# Patient Record
Sex: Male | Born: 1990
Health system: Southern US, Community
[De-identification: ages and names within clinical notes are randomized; demographics above are authoritative.]

## PROBLEM LIST (undated history)

## (undated) DIAGNOSIS — G8929 Other chronic pain: Secondary | ICD-10-CM

## (undated) DIAGNOSIS — R740 Nonspecific elevation of levels of transaminase and lactic acid dehydrogenase [LDH]: Secondary | ICD-10-CM

## (undated) DIAGNOSIS — R7611 Nonspecific reaction to tuberculin skin test without active tuberculosis: Secondary | ICD-10-CM

## (undated) DIAGNOSIS — M549 Dorsalgia, unspecified: Secondary | ICD-10-CM

## (undated) DIAGNOSIS — M546 Pain in thoracic spine: Secondary | ICD-10-CM

## (undated) HISTORY — DX: Nonspecific reaction to tuberculin skin test without active tuberculosis: R76.11

## (undated) HISTORY — DX: Pain in thoracic spine: M54.6

## (undated) HISTORY — PX: TRIGGER POINT INJECTION: SHX2580

## (undated) HISTORY — DX: Dorsalgia, unspecified: M54.9

## (undated) HISTORY — DX: Nonspecific elevation of levels of transaminase and lactic acid dehydrogenase (ldh): R74.0

## (undated) HISTORY — DX: Other chronic pain: G89.29

---

## 2017-05-25 ENCOUNTER — Ambulatory Visit (INDEPENDENT_AMBULATORY_CARE_PROVIDER_SITE_OTHER): Payer: BLUE CROSS/BLUE SHIELD | Admitting: Physician Assistant

## 2017-05-25 ENCOUNTER — Encounter: Payer: Self-pay | Admitting: Physician Assistant

## 2017-05-25 VITALS — BP 128/87 | HR 73 | Ht 66.0 in | Wt 167.0 lb

## 2017-05-25 DIAGNOSIS — G2589 Other specified extrapyramidal and movement disorders: Secondary | ICD-10-CM | POA: Insufficient documentation

## 2017-05-25 DIAGNOSIS — Z7689 Persons encountering health services in other specified circumstances: Secondary | ICD-10-CM

## 2017-05-25 DIAGNOSIS — M546 Pain in thoracic spine: Secondary | ICD-10-CM

## 2017-05-25 DIAGNOSIS — Z111 Encounter for screening for respiratory tuberculosis: Secondary | ICD-10-CM

## 2017-05-25 DIAGNOSIS — M542 Cervicalgia: Secondary | ICD-10-CM | POA: Diagnosis not present

## 2017-05-25 DIAGNOSIS — G8929 Other chronic pain: Secondary | ICD-10-CM | POA: Diagnosis not present

## 2017-05-25 DIAGNOSIS — M549 Dorsalgia, unspecified: Secondary | ICD-10-CM | POA: Diagnosis not present

## 2017-05-25 HISTORY — DX: Other chronic pain: G89.29

## 2017-05-25 MED ORDER — BACLOFEN 10 MG PO TABS: 10.0000 mg | ORAL_TABLET | Freq: Three times a day (TID) | ORAL | 0 refills | Status: DC | PRN

## 2017-05-25 NOTE — Progress Notes (Addendum)
HPI:                                                                Isaac Myers is a 27 y.o. male who presents to [place]: [place] today to establish care  Patient has waived right to licensed interpreter and wife is acting as interpreter today.  Current concerns: chronic back pain  Patient reports persistent midline thoracic back pain between his shoulder blades x 2 years. He has seen multiple primary care doctors and an orthopedist for this problem. Pain is daily, intermittent, moderate in severity. Describes pain as achey and fatigued. Pain does not radiate. States pain is worse with sitting for a long time and with scapular retraction. Denies neck pain. Denies constitutional symptoms. Denies extremity weakness or paresthesias.  Reports nothing helps. 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 He denies any history of trauma or injury however.  Reports he has had normal x-rays of his spine at [facility]  Prior treatments: chiropractor, physical therapy x 3 months, trigger point injections x 2, NSAIDs (caused nausea)  Depression screen [facility] 2/9 05/25/2017  Decreased Interest 0  Down, Depressed, Hopeless 0  PHQ - 2 Score 0     Past Medical History:  Diagnosis Date  . Chronic back pain    Past Surgical History:  Procedure Laterality Date  . TRIGGER POINT INJECTION     back   Social History   Tobacco Use  . Smoking status: Never Smoker  . Smokeless tobacco: Never Used  Substance Use Topics  . Alcohol use: Yes   family history includes Diabetes in his mother.  ROS: Review of Systems  Constitutional: Positive for malaise/fatigue. Negative for chills, diaphoresis, fever and weight loss.  Respiratory: Positive for cough.   Musculoskeletal: Positive for back pain. Negative for joint pain and neck pain.  Neurological: Negative for sensory change and focal weakness.   Psychiatric/Behavioral: The patient has insomnia.   All other systems reviewed and are negative.    Medications: Current Outpatient Medications  Medication Sig Dispense Refill  . baclofen (LIORESAL) 10 MG tablet Take 1 tablet (10 mg total) by mouth 3 (three) times daily as needed for muscle spasms. 60 each 0   No current facility-administered medications for this visit.    No Known Allergies     Objective:  BP 128/87   Pulse 73   Ht _0  (1.676 m)   Wt 167 lb (75.8 kg)   SpO2 97%   BMI 26.95 kg/m  Gen:  alert, not ill-appearing, no distress, appropriate for age 45: head normocephalic without obvious abnormality, conjunctiva and cornea clear, trachea midline Pulm: Normal work of breathing, normal phonation Neuro: alert and oriented x 3, normal tone, no tremor MSK: back atraumatic, no deformity, full active ROM of spine, scapular retraction and external rotation reproduce pain, tender over the mid- thoracic spine at the level of the inferior angle of the scapula, full ROM of shoulders, strength 5/5 symmetric in bilateral upper extremities; normal gait and station Skin: intact, no rashes on exposed skin, no jaundice, no cyanosis Psych: well-groomed, cooperative, good eye contact, euthymic mood, affect mood-congruent, speech is articulate, and thought processes clear and goal-directed  No results found for this or any previous visit (from the past 72 hour(s)). No results found.  Thoracolumbar X-ray 10/24/2016    Assessment and Plan: 27 y.o. male with   1. Encounter to establish care - reviewed PMH, PSH, PFH, medications and allergies - reviewed health maintenance - due for influenza and Tdap - negative PHQ2  2. Chronic midline thoracic back pain - patient has had negative x-rays of the spine, failed conservative measures including PT, antiinflammatories, chiropractor, and trigger point injections. Proceeding with MR C-spine and Thoracic spine given pain is  periscapular - pain is axial, no radicular symptoms, no red flags. Will proceed with basic rheumatologic work-up, rule out TB/POTS with ppd and CXR - trial of Baclofen tid  - baclofen (LIORESAL) 10 MG tablet; Take 1 tablet (10 mg total) by mouth 3 (three) times daily as needed for muscle spasms.  Dispense: 60 each; Refill: 0 - MR Thoracic Spine Wo Contrast - MR Cervical Spine Wo Contrast; Future - DG Chest 2 View; Future - DG Thoracic Spine W/Swimmers - HLA-B27 antigen - CBC with Differential/Platelet - Comprehensive metabolic panel - Sedimentation rate   3. Encounter for PPD test - TB Skin Test, placed today - he will go to urgent care on Sunday for PPD read. Spoke to [facility] RN and confirmed  4. Cervicalgia - MR Cervical Spine Wo Contrast; Future  Patient education and anticipatory guidance given Patient agrees with treatment plan Follow-up in 2 weeks with Sports Medicine or sooner as needed if symptoms worsen or fail to improve  Darlyne Russian PA-C

## 2017-05-25 NOTE — Patient Instructions (Addendum)
-   Go to Urgent Care on Sunday to have PPD read  - Go to X-ray and Lab today  - Trial of Baclofen for pain

## 2017-05-27 ENCOUNTER — Emergency Department
Admission: EM | Admit: 2017-05-27 | Discharge: 2017-05-27 | Disposition: A | Discharge status: Home / Self Care | Payer: Self-pay | Source: Home / Self Care

## 2017-05-27 ENCOUNTER — Other Ambulatory Visit: Payer: Self-pay

## 2017-05-27 ENCOUNTER — Encounter: Payer: Self-pay | Admitting: *Deleted

## 2017-05-27 DIAGNOSIS — R7611 Nonspecific reaction to tuberculin skin test without active tuberculosis: Secondary | ICD-10-CM | POA: Diagnosis not present

## 2017-05-27 NOTE — ED Triage Notes (Signed)
Interpreter service used for patient interaction. ID [id number]  Patient is here for a PPD read [facility] [place] [facility] [place]. LFA is >7320mm induration & redness. Patient c/o dry cough x 3 months with body aches and HA. Denies travel out of the [place] in the past month. Per Waylan RocherErin Phelps, PA-C instruction, advised to call PCP office 1st thing tomorrow AM and and for an appointment for treatment, further testing and follow up. Encouraged patient to cover cough, do not share utensils and use good hand hygiene. Patient verbalized understanding and follow up instructions. Hand out of TB skin test/results given in Spanish.

## 2017-05-29 ENCOUNTER — Encounter: Payer: Self-pay | Admitting: Physician Assistant

## 2017-05-29 ENCOUNTER — Ambulatory Visit (INDEPENDENT_AMBULATORY_CARE_PROVIDER_SITE_OTHER): Payer: BLUE CROSS/BLUE SHIELD

## 2017-05-29 DIAGNOSIS — M549 Dorsalgia, unspecified: Secondary | ICD-10-CM | POA: Diagnosis not present

## 2017-05-29 DIAGNOSIS — J8410 Pulmonary fibrosis, unspecified: Secondary | ICD-10-CM

## 2017-05-29 DIAGNOSIS — Z227 Latent tuberculosis: Secondary | ICD-10-CM

## 2017-05-29 DIAGNOSIS — R7611 Nonspecific reaction to tuberculin skin test without active tuberculosis: Secondary | ICD-10-CM

## 2017-05-29 DIAGNOSIS — M5083 Other cervical disc disorders, cervicothoracic region: Secondary | ICD-10-CM | POA: Diagnosis not present

## 2017-05-29 DIAGNOSIS — M542 Cervicalgia: Secondary | ICD-10-CM | POA: Diagnosis not present

## 2017-05-29 DIAGNOSIS — M546 Pain in thoracic spine: Secondary | ICD-10-CM

## 2017-05-29 DIAGNOSIS — G8929 Other chronic pain: Secondary | ICD-10-CM

## 2017-05-29 DIAGNOSIS — R05 Cough: Secondary | ICD-10-CM | POA: Diagnosis not present

## 2017-05-29 HISTORY — DX: Nonspecific reaction to tuberculin skin test without active tuberculosis: R76.11

## 2017-05-29 LAB — SEDIMENTATION RATE: Sed Rate: 2 mm/h (ref 0–15)

## 2017-05-29 LAB — CBC WITH DIFFERENTIAL/PLATELET
BASOS ABS: 7 {cells}/uL (ref 0–200)
Basophils Relative: 0.1 %
EOS ABS: 158 {cells}/uL (ref 15–500)
Eosinophils Relative: 2.2 %
HEMATOCRIT: 46.4 % (ref 38.5–50.0)
Hemoglobin: 16.2 g/dL (ref 13.2–17.1)
Lymphs Abs: 2498 cells/uL (ref 850–3900)
MCH: 29.6 pg (ref 27.0–33.0)
MCHC: 34.9 g/dL (ref 32.0–36.0)
MCV: 84.8 fL (ref 80.0–100.0)
MPV: 11.1 fL (ref 7.5–12.5)
Monocytes Relative: 7.6 %
NEUTROS PCT: 55.4 %
Neutro Abs: 3989 cells/uL (ref 1500–7800)
PLATELETS: 239 10*3/uL (ref 140–400)
RBC: 5.47 10*6/uL (ref 4.20–5.80)
RDW: 12.4 % (ref 11.0–15.0)
Total Lymphocyte: 34.7 %
WBC: 7.2 10*3/uL (ref 3.8–10.8)
WBCMIX: 547 {cells}/uL (ref 200–950)

## 2017-05-29 LAB — COMPREHENSIVE METABOLIC PANEL
AG Ratio: 1.6 (calc) (ref 1.0–2.5)
ALBUMIN MSPROF: 4.9 g/dL (ref 3.6–5.1)
ALKALINE PHOSPHATASE (APISO): 87 U/L (ref 40–115)
ALT: 95 U/L — ABNORMAL HIGH (ref 9–46)
AST: 39 U/L (ref 10–40)
BILIRUBIN TOTAL: 0.6 mg/dL (ref 0.2–1.2)
BUN: 11 mg/dL (ref 7–25)
CALCIUM: 10 mg/dL (ref 8.6–10.3)
CO2: 30 mmol/L (ref 20–32)
CREATININE: 0.89 mg/dL (ref 0.60–1.35)
Chloride: 101 mmol/L (ref 98–110)
Globulin: 3 g/dL (calc) (ref 1.9–3.7)
Glucose, Bld: 91 mg/dL (ref 65–99)
POTASSIUM: 4.5 mmol/L (ref 3.5–5.3)
Sodium: 139 mmol/L (ref 135–146)
Total Protein: 7.9 g/dL (ref 6.1–8.1)

## 2017-05-29 LAB — HLA-B27 ANTIGEN: HLA-B27 ANTIGEN: NEGATIVE

## 2017-05-30 ENCOUNTER — Ambulatory Visit: Payer: BLUE CROSS/BLUE SHIELD | Admitting: Physician Assistant

## 2017-05-30 ENCOUNTER — Encounter: Payer: Self-pay | Admitting: Physician Assistant

## 2017-05-30 DIAGNOSIS — R740 Nonspecific elevation of levels of transaminase and lactic acid dehydrogenase [LDH]: Secondary | ICD-10-CM

## 2017-05-30 DIAGNOSIS — R7401 Elevation of levels of liver transaminase levels: Secondary | ICD-10-CM

## 2017-05-30 HISTORY — DX: Elevation of levels of liver transaminase levels: R74.01

## 2017-05-30 NOTE — Progress Notes (Unsigned)
Referral to ID placed for latent tuberculosis

## 2017-05-30 NOTE — Progress Notes (Signed)
Labs all look normal except for a mildly elevated liver enzyme. Plan to recheck this in 4 weeks. As you know, PPD test was positive. Chest x-ray did not show any active lung disease. It does show evidence of a prior TB infection. This is called latent TB. This is not contagious. Sometimes, this is treated with an oral medication to prevent active infection. I have consulted the infectious disease department and we may end up referring you to them. I will update you as soon as possible. Lastly, I do not yet have the results from the MRI. They should be in this afternoon. We will contact you asap.

## 2017-06-02 ENCOUNTER — Other Ambulatory Visit (HOSPITAL_BASED_OUTPATIENT_CLINIC_OR_DEPARTMENT_OTHER): Payer: BLUE CROSS/BLUE SHIELD

## 2017-06-04 ENCOUNTER — Encounter: Payer: Self-pay | Admitting: Family

## 2017-06-04 ENCOUNTER — Ambulatory Visit (INDEPENDENT_AMBULATORY_CARE_PROVIDER_SITE_OTHER): Payer: BLUE CROSS/BLUE SHIELD | Admitting: Family

## 2017-06-04 VITALS — BP 135/85 | HR 71 | Temp 97.5°F | Wt 169.0 lb

## 2017-06-04 DIAGNOSIS — R7611 Nonspecific reaction to tuberculin skin test without active tuberculosis: Secondary | ICD-10-CM | POA: Diagnosis not present

## 2017-06-04 NOTE — Assessment & Plan Note (Signed)
Isaac Myers appears to have latent TB with a positive PPD and negative chest x-ray. I do not think this is the cause of his back pain as previous imaging has been normal. He is at risk for converting to active TB. It is reasonable to obtain a CT scan to confirm no active disease prior to treatment for latent TB. We discussed the risks and benefits of the treatments and pending the CT scan results he wishes to continue with treatment through 4 months of rifampin. We will follow up in 1 month following the initiation of treatment.

## 2017-06-04 NOTE — Patient Instructions (Signed)
Nice to meet you!  We will get a CT scan of your chest to ensure no active disease prior to starting treatment.   If negative, we will send you in a medication call rifampin which you will take daily for 4 months for latent tuberculosis.    We will plan to follow up in a month following starting the medication.   Tuberculosis Tuberculosis La tuberculosis (TB) es una infeccin bacteriana que produce daos en los tejidos del cuerpo. Por lo general, afecta los pulmones, pero puede afectar otras partes del cuerpo. La TB tiene dos etapas:  TB activa. En esta etapa, la persona presenta los sntomas de TB y puede transmitir la enfermedad fcilmente (es contagiosa).  TB latente. En esta etapa, la persona no tiene ningn sntoma de TB y no contagia la enfermedad.  Es importante recibir tratamiento ms all del tipo de TB, porque la TB latente puede convertirse en TB [place]. Cules son las causas? La causa de la TB es una bacteria llamada Mycobacterium tuberculosis. Puede ocurrir una infeccin cuando Neomia Dear persona con TB activa tose o estornuda y Nature conservation officer la bacteria en el aire. Otra persona puede aspirar la bacteria, la que llega a los pulmones y causa una infeccin. Qu incrementa el riesgo? Entre los factores de riesgo se incluyen los siguientes:  VIH/sida.  Diabetes.  La edad. Los bebs y las personas de edad avanzada tienen ms probabilidades de contraer TB.  Consumir drogas ilegales.  El consumo de tabaco.  Vivir en o viajar a lugares en donde las tasas de TB son altas, por ejemplo: ? frica subsahariana. ? [place]. ? [place]. ? [place]. ? [place].  El hecho de vivir o de Printmaker en reas superpobladas o con mala ventilacin, lo que incluye: ? Establecimientos de atencin mdica. ? Establecimientos de atencin residencial. ? Campos de refugiados o refugios para personas sin hogar.  Cules son los signos o los sntomas?  Tos que dura tres semanas o ms.  Dolor en el pecho o  dolor al respirar o toser.  Prdida de peso sin causa aparente.  Fatiga y debilidad.  Grant Ruts.  Sudoracin.  Escalofros.  Prdida del apetito.  Tos con sangre o flema (esputo), o ambas cosas. Cmo se diagnostica? El diagnstico de TB puede incluir lo siguiente:  Un examen fsico y Neomia Dear historia clnica.  Una prueba cutnea.  Anlisis de [place].  Radiografas de trax.  Un estudio del esputo.  Anlisis de [place].  Cmo se trata? La TB se trata con antibiticos, que deben tomarse de 6a . Siga estas instrucciones en su casa:  Tome los antibiticos como se lo haya indicado el mdico. Termine los antibiticos aunque comience a sentirse mejor.  Concurra a todas las visitas de control como se lo haya indicado el mdico. Esto es importante.  Informe al mdico sobre todas las personas que viven con usted o con las que tiene contacto cercano. Su mdico o el Departamento de Salud puede hablar con estas personas para que se hagan estudios de TB.  Descanse todo lo que sea necesario.  No consuma ningn producto que contenga tabaco, lo que incluye cigarrillos, tabaco de Theatre manager o Administrator, Civil Service. Si necesita ayuda para dejar de fumar, consulte al American Express.  Evite el contacto cercano con los dems, especialmente con los bebs y las personas de edad avanzada, Teacher, adult education que el mdico le diga que ya no contagiar la TB.  Al toser o estornudar, cbrase la boca y la [place]. Deseche los pauelos descartables usados como se lo  haya indicado el mdico.  Lave sus manos frecuentemente con agua y [place].  No regrese al Aleen Campi ni al mbito de estudio hasta que el mdico lo autorice. Comunquese con un mdico si:  Aparecen nuevos sntomas.  Pierde el apetito.  Siente nuseas o vomita.  Orina de color amarillo oscuro.  La piel o la parte blanca de los ojos se torna de Educational psychologist.  Los sntomas persisten o empeoran.  Tiene fiebre. Solicite ayuda de inmediato  si:  Midwife.  Tose y escupe sangre.  Siente dificultad para respirar o Company secretary.  Siente dolor de [place] o rigidez en el cuello. Esta informacin no tiene Theme park manager el consejo del mdico. Asegrese de hacerle al mdico cualquier pregunta que tenga. Document Released: 02/08/2005 Document Revised: 07/26/2016 Document Reviewed: 08/06/2013 Elsevier Interactive Patient Education  2018 [employer].  Rifampin capsules Qu es este medicamento? La RIFAMPICINA es un antibitico. Se utiliza para tratar o prevenir ciertos tipos de infecciones bacterianas. Se utiliza para tratar o prevenir la tuberculosis (TB). No es efectivo para resfros, gripe u otras infecciones de origen viral. Este medicamento puede ser utilizado para otros usos; si tiene alguna pregunta consulte con su proveedor de atencin mdica o con su farmacutico. MARCAS COMUNES: Rifadin, Rimactane Qu le debo informar a mi profesional de la salud antes de tomar este medicamento? Necesita saber si usted presenta alguno de los [employer] o situaciones: -enfermedad heptica, incluyendo hepatitis -una reaccin alrgica o inusual a la rifampicina, la rifabutina, a otros medicamentos, alimentos, colorantes o conservantes -si est embarazada o buscando quedar embarazada -si est amamantando a un beb Cmo debo utilizar este medicamento? Tome este medicamento por va oral con un vaso de agua. Siga las instrucciones de la etiqueta del [place]. [employer] Communications medicamento con el estmago vaco, entre 1 hora antes o por lo menos 2 horas despus de las comidas. Tome sus dosis a intervalos regulares. No tome su medicamento con una frecuencia mayor a la indicada. Para asegurarse de que su terapia funcione de la mejor manera posible, asegrese de tomar cada dosis exactamente como le hayan indicado. No omita ninguna dosis o suspenda el uso de su medicamento aun si se siente mejor. Si omite dosis la TB puede  hacerse resistente a Industrial/product designer y [employer]. No deje de tomarlo excepto si as lo indica su mdico. Hable con su pediatra o su profesional de la salud para informarse acerca del uso de [place] medicamento en nios. Puede requerir atencin especial. Sobredosis: Pngase en contacto inmediatamente con un centro toxicolgico o una sala de urgencia si usted cree que haya tomado demasiado medicamento. ATENCIN: Reynolds American es solo para usted. No comparta este medicamento con nadie. Qu sucede si me olvido de una dosis? Si olvida una dosis, tmela lo antes posible. Si es casi la hora de la prxima dosis, tome slo esa dosis. No tome dosis adicionales o dobles. Qu puede interactuar con este medicamento? No tome esta medicina con ninguno de los siguientes medicamentos: -delarvidina -nevirapina -sirolims -voriconazol Esta medicina tambin puede interactuar con los siguientes medicamentos: -antibiticos, tales como ciprofloxacina, claritromicina, isoniazida -medicamentos antimicticos, tales como fluconazol, itraconazol, quetoconazol -atovacuona -cloranfenicol -ciclosporina -dapsona -hormonas femeninas, incluyendo el contraconceptivo o las pldoras anticonceptivas -halotano -medicamentos para la presin sangunea, otros problemas cardiacos -medicamentos para la depresin, ansiedad o trastornos psicticos -medicamentos para la diabetes -medicamentos para Chief Technology Officer -medicamentos para convulsiones, tales como carbamazepina, fenobarbital, fenitona -medicamentos para conciliar el sueo -medicamento para la tiroidea -  medicamentos que tratan o previenen cogulos sanguneos, como warfarina -probenecid -medicamentos esteroideos, como la prednisona o la cortisona -vitamina D Puede ser que esta lista no menciona todas las posibles interacciones. Informe a su profesional de Beazer Homes de In[employer] Inc productos a base de hierbas, medicamentos de [place] o suplementos nutritivos que est  tomando. Si usted fuma, consume bebidas alcohlicas o si utiliza drogas ilegales, indqueselo tambin a su profesional de [employer]. Algunas sustancias pueden interactuar con su medicamento. A qu debo estar atento al usar PPL Corporation? Visite a su mdico o a su profesional de la salud para chequear su evolucin peridicamente. Este medicamento puede causar graves problemas hepticos. Asegrese de [employer]ys [employer] Us de tener problemas hepticos y de saber cmo identificar los sntomas. Si tiene [employer] International, consulte a su mdico o a su proveedor de Psychologist, prison and probation services. Evite las bebidas alcohlicas durante el tratamiento con este medicamento. Al consumir alcohol durante el tratamiento con [place], aumentan los riesgos de sufrir problemas hepticos graves. Las pldoras anticonceptivas pueden no actuar correctamente mientras est utilizando PPL Corporation. Consulte a su mdico acerca de un mtodo anticonceptivo adicional. Los anticidos pueden reducir la absorcin de Industrial/product designer. Debe administrar las dosis de PPL Corporation por lo menos 1 hora antes de tomar anticidos. Este medicamento puede producir una coloracin anaranjada-rojiza o marrn-rojiza de la orina, las heces, la transpiracin (sudoracin), las lgrimas, el esputo, la piel o la saliva. Este color puede perdurar durante todo el tiempo que toma este medicamento y no debe causarle inquietud. Esta coloracin puede IAC/InterActiveCorp lentes de contacto blandos en forma [place]. Es preferible no usar lentes de contacto blandos mientras est tomando PPL Corporation. Qu efectos secundarios puedo tener al Boston Scientific este medicamento? Efectos secundarios que debe informar a su mdico o a Producer, television/film/video de la salud tan pronto como sea posible: Therapist, art, como erupcin cutnea, comezn/picazn o urticarias, e hinchazn de la cara, los labios o la lengua diarrea con sangre o acuosa problemas respiratorios moretones,  puntos rojos en la piel, heces de color negro y aspecto alquitranado, sangre en la orina sensacin de Corporate investment banker o aturdimiento, cadas fiebre o escalofros fiebre con erupcin cutnea, ganglios linfticos inflamados o hinchazn del rostro sensacin general de estar enfermo o sntomas gripales llagas en la boca enrojecimiento, formacin de ampollas, descamacin o distensin de la piel, incluso dentro de la boca dolor de garganta dolor estomacal dificultad para orinar o cambios en el volumen de orina sangrado o moretones inusuales cansancio o debilidad inusual color amarillento de los ojos o la piel Efectos secundarios que generalmente no requieren atencin mdica (infrmelos a su mdico o a su profesional de la salud si persisten o si son molestos): diarrea mareos somnolencia confusin dolor de cabeza prdida del apetito nuseas, vmito Puede ser que esta lista no menciona todos los posibles efectos secundarios. Comunquese a su mdico por asesoramiento mdico Hewlett-[employer]ard. Usted puede informar los efectos secundarios a la FDA por telfono al 1-800-FDA-1088. Dnde debo guardar mi medicina? Mantngala fuera del alcance de los nios. Gurdela a Sanmina-SCI, menos de 30 grados C (86 grados F). Protjala de la luz y de la humedad. Mantenga el envase bien cerrado. Deseche los medicamentos que no haya utilizado, despus de la fecha de vencimiento. ATENCIN: Este folleto es un resumen. Puede ser que no cubra toda la posible informacin. Si usted tiene preguntas acerca de esta medicina, consulte con su mdico, su farmacutico o su profesional de Radiographer, therapeutic.  2018  Elsevier/Gold Standard (2015-11-11 00:00:00)

## 2017-06-04 NOTE — Progress Notes (Signed)
Subjective:    Patient ID: Isaac Myers, male    DOB: Oct 28, 1990, 27 y.o.   MRN: [id number]  Chief Complaint  Patient presents with  . Positive PPD    HPI:   Isaac Myers is a 27 y.o. male who for evaluation of a positive TB skin test.  His wife is present for today's visit and at his request serving as a Nurse, learning disabilitytranslator. A medical translator was available and refused.   Isaac Myers has had the associated symptom of pain located in his thoracic spine that is going on for about 2 years now. 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 Previous imaging including X-ray and MRI have shown no significant pathology. He recently was seen by a new primary care provider seeking out causes for the potential back pain and ordered a TB skin test that came back positive with >20 mm induration at the time. A chest x-ray completed showed no evidence of acute lung disease with small calcified granulomas at the base of the lung consistent with prior granulomatous disease. He was referred to the ID for further assessment and treatment.   He states that he has had a dry cough that has been going on for about 2-3 months with an occasional night sweat occurring 1-2 times per week. He has had no fevers or significant weight loss. [place] and denies any family history of tuberculosis or BCG vaccination. He has not been in any correctional facility, hospitalized, or been exposed to anyone with TB that he is aware of.   No Known Allergies   Outpatient Medications Prior to Visit  Medication Sig Dispense Refill  . baclofen (LIORESAL) 10 MG tablet Take 1 tablet (10 mg total) by mouth 3 (three) times daily as needed for muscle spasms. (Patient not taking: Reported on 06/04/2017) 60 each 0   No facility-administered medications prior to visit.      Past Medical History:  Diagnosis Date  . Chronic back pain   . Chronic midline  thoracic back pain 05/25/2017  . Elevated ALT measurement 05/30/2017  . Positive PPD 05/29/2017    Past Surgical History:  Procedure Laterality Date  . TRIGGER POINT INJECTION     back    Family History  Problem Relation Age of Onset  . Diabetes Mother   . Autoimmune disease Neg Hx       Social History   Socioeconomic History  . Marital status: Married    Spouse name: Laurdes  . Number of children: 3  . Years of education: 9th grade  . Highest education level: Not on file  Social Needs  . Financial resource strain: Not on file  . Food insecurity - worry: Not on file  . Food insecurity - inability: Not on file  . Transportation needs - medical: Not on file  . Transportation needs - non-medical: Not on file  Occupational History  . Not on file  Tobacco Use  . Smoking status: Never Smoker  . Smokeless tobacco: Never Used  Substance and Sexual Activity  . Alcohol use: Yes  . Drug use: No  . Sexual activity: Yes  Other Topics Concern  . Not on file  Social History Narrative  . Not on file    Review of Systems  Constitutional: Negative for chills, fever and unexpected weight change.  Respiratory: Positive for cough. Negative for chest tightness, shortness of breath and wheezing.  Cardiovascular: Negative for chest pain, palpitations and leg swelling.  Musculoskeletal: Positive for back pain.  Neurological: Negative for dizziness, numbness and headaches.       Objective:    BP 135/85   Pulse 71   Temp (!) 97.5 F (36.4 C) (Oral)   Wt 169 lb (76.7 kg)   BMI 27.28 kg/m  Nursing note and vital signs reviewed.  Physical Exam  Constitutional: He is oriented to person, place, and time. He appears well-developed. No distress.  Neck: Neck supple.  Cardiovascular: Normal rate, regular rhythm, normal heart sounds and intact distal pulses. Exam reveals no gallop and no friction rub.  No murmur heard. Pulmonary/Chest: Effort normal and breath sounds normal. No  respiratory distress. He has no wheezes. He has no rales. He exhibits no tenderness.  Abdominal: Soft. Bowel sounds are normal.  Musculoskeletal:  Thoracic spine - no obvious deformity, discoloration, or edema. Tenderness of T3-7 on the spinous processes with no crepitus or deformity. Range of motion within normal limits. Distal pulses and sensation are intact and appropriate.  Neurological: He is alert and oriented to person, place, and time.  Skin: Skin is warm and dry. No rash noted.  Psychiatric: He has a normal mood and affect. His behavior is normal. Judgment and thought content normal.        Assessment & Plan:   Problem List Items Addressed This Visit      Other   Positive PPD - Primary    [name] appears to have latent TB with a positive PPD and negative chest x-ray. I do not think this is the cause of his back pain as previous imaging has been normal. He is at risk for converting to active TB. It is reasonable to obtain a CT scan to confirm no active disease prior to treatment for latent TB. We discussed the risks and benefits of the treatments and pending the CT scan results he wishes to continue with treatment through 4 months of rifampin. We will follow up in 1 month following the initiation of treatment.       Relevant Orders   CT CHEST WO CONTRAST      I am having Isaac Myers maintain his baclofen.   Follow-up: Return in about 1 month (around 07/05/2017).   Jeanine Luz, FNP [facility]

## 2017-06-06 ENCOUNTER — Other Ambulatory Visit: Payer: BLUE CROSS/BLUE SHIELD

## 2017-06-11 ENCOUNTER — Encounter: Payer: Self-pay | Admitting: Sports Medicine

## 2017-06-11 ENCOUNTER — Ambulatory Visit (INDEPENDENT_AMBULATORY_CARE_PROVIDER_SITE_OTHER): Payer: BLUE CROSS/BLUE SHIELD | Admitting: Sports Medicine

## 2017-06-11 DIAGNOSIS — F39 Unspecified mood [affective] disorder: Secondary | ICD-10-CM

## 2017-06-11 DIAGNOSIS — G2589 Other specified extrapyramidal and movement disorders: Secondary | ICD-10-CM

## 2017-06-11 MED ORDER — MELOXICAM 15 MG PO TABS: ORAL_TABLET | ORAL | 3 refills | Status: DC

## 2017-06-11 MED ORDER — DULOXETINE HCL 30 MG PO CPEP
30.0000 mg | ORAL_CAPSULE | Freq: Every day | ORAL | 3 refills | Status: DC
Start: 2017-06-11 — End: 2017-07-05

## 2017-06-11 NOTE — Progress Notes (Addendum)
Subjective:    I'm seeing this patient as a consultation for:  Isaac Myers, [facility]  CC: Thoracic back pain  HPI: Patient has 2 year history of dull, achy, periscapular thoracic back pain. Pain has been present and stable for the past several years. Patient had trial of physical therapy for 3 months. Patient states that this helped minimally, but was mostly focused on massage therapy. He did not participate in strengthening physical therapy for the periscapular muscles. Otherwise, medications have helped minimally. Currently patient is taking tylenol and NSAIDs intermittently, and these help temporarily.   Of note, patient is followed by infectious disease for latent TB. Has not yet started rifampin. Patient did have negative CT spine, considering POTS disease. Patient had MRI with C7-T1 cervical disc protrusion. Symptoms not likely attributable to disc disease.  Past medical history, Surgical history, Family history not pertinant except as noted below, Social history, Allergies, and medications have been entered into the medical record, reviewed, and no changes needed.   Review of Systems: No headache, visual changes, nausea, vomiting, diarrhea, constipation, dizziness, abdominal pain, skin rash, fevers, chills, night sweats, weight loss, swollen lymph nodes, body aches, joint swelling, muscle aches, chest pain, shortness of breath, visual or auditory hallucinations.   Positive for mood changes, fatigue.  Objective:    Vitals:   06/11/17 0839  BP: 122/73  Pulse: 66  SpO2: 98%   General: Well Developed, well nourished, and in no acute distress.  Neuro/Psych: Alert and oriented x3, extra-ocular muscles intact, able to move all 4 extremities, sensation grossly intact. Skin: Warm and dry, no rashes noted.  Respiratory: Not using accessory muscles, speaking in full sentences, trachea midline.  Cardiovascular: Pulses palpable, no extremity edema. Abdomen: Does not appear  distended. MSK:  Cervical:  Normal in appearance. No tenderness to palpation over cervical spine. Normal ROM without pain. No weakness in trapezius, sternocleidomastoid.  Thoracic: Normal in appearance without scapular winging. No thoracic tenderness. Periscapular and perispinal tenderness.  Pain reproduced with scapular retraction.   Depression screen [facility] 2/[facility] 06/11/2017 06/04/2017 05/25/2017  Decreased Interest 2 0 0  Down, Depressed, Hopeless 3 0 0  PHQ - 2 Score 5 0 0  Altered sleeping 3 - -  Tired, decreased energy 3 - -  Change in appetite 0 - -  Feeling bad or failure about yourself  0 - -  Trouble concentrating 0 - -  Moving slowly or fidgety/restless 0 - -  Suicidal thoughts 0 - -  PHQ-9 Score 11 - -   GAD 7 : Generalized Anxiety Score 06/11/2017  Nervous, Anxious, on Edge 2  Control/stop worrying 3  Worry too much - different things 3  Trouble relaxing 2  Restless 3  Easily annoyed or irritable 3  Afraid - awful might happen 0  Total GAD 7 Score 16     No results found for this or any previous visit (from the past 24 hour(s)). No results found.  Impression and Recommendations:    Assessment and Plan: 27 y.o. male with periscapular pain, likely muscular in nature. Patient had prior physical therapy, but this was mostly focused on massage and not strengthening. Patient also endorsing significant mood abnormalities, including depressed mood, sadness, fatigue, and aggression.   Patient will benefit from scapular strengthening exercises with physical therapy. Patient amenable to physical therapy. Will also prescribe meloxicam to be taken once daily for pain.  Patient will also benefit from a trial of duloxetine for presumed depression. PHQ9 11, GAD 16.  Patient amenable to medication. Patient instructed to take for at least 4 weeks and follow up thereafter.    Orders Placed This Encounter  Procedures  . Ambulatory referral to Physical Therapy    Referral  Priority:   Routine    Referral Type:   Physical Medicine    Referral Reason:   Specialty Services Required    Requested Specialty:   Physical Therapy   Meds ordered this encounter  Medications  . meloxicam (MOBIC) 15 MG tablet    Sig: One tab PO qAM with breakfast for 2 weeks, then daily prn pain.    Dispense:  30 tablet    Refill:  3  . DULoxetine (CYMBALTA) 30 MG capsule    Sig: Take 1 capsule (30 mg total) by mouth daily.    Dispense:  30 capsule    Refill:  3    Discussed warning signs or symptoms. Please see discharge instructions. Patient expresses understanding.

## 2017-06-11 NOTE — Assessment & Plan Note (Signed)
Negative cervical and thoracic spine MRI with the exception of a small C7-T1 protruding disc, no central or foraminal stenosis. He is done some physical therapy but mostly massage. Adding additional physical therapy but this time aggressive scapular retraction strengthening exercises. Meloxicam. Also going to treat his concurrent depression.

## 2017-06-11 NOTE — Assessment & Plan Note (Signed)
Adding Cymbalta 30. PHQ/GAD in 1 month.

## 2017-06-14 ENCOUNTER — Telehealth: Payer: Self-pay | Admitting: Family

## 2017-06-14 MED ORDER — RIFAMPIN 300 MG PO CAPS: 600.0000 mg | ORAL_CAPSULE | Freq: Every day | ORAL | 3 refills | Status: DC

## 2017-06-14 NOTE — Telephone Encounter (Signed)
Please inform patient that I have sent in a prescription for him to start Rifampin for his latent TB. We will need to check blood work at his up coming appointment. It does have the possibility of turning body fluids including urine and sweat an orange color which is normal.

## 2017-06-15 NOTE — Telephone Encounter (Signed)
Called patient per Isaac EkeGreg Calone, NP to inform the patient that his Rifampin was ready for him to pick up at [facility]. Patient is aware and states he will pick up his medication after work.  Isaac CourierJose L Maldonado, [place]MA

## 2017-06-28 ENCOUNTER — Ambulatory Visit: Payer: BLUE CROSS/BLUE SHIELD | Admitting: Physical Therapy

## 2017-07-05 ENCOUNTER — Ambulatory Visit: Payer: BLUE CROSS/BLUE SHIELD | Admitting: Family

## 2017-07-05 ENCOUNTER — Encounter: Payer: Self-pay | Admitting: Physician Assistant

## 2017-07-05 ENCOUNTER — Ambulatory Visit (INDEPENDENT_AMBULATORY_CARE_PROVIDER_SITE_OTHER): Payer: BLUE CROSS/BLUE SHIELD | Admitting: Physician Assistant

## 2017-07-05 VITALS — BP 135/91 | HR 80 | Wt 169.0 lb

## 2017-07-05 DIAGNOSIS — R030 Elevated blood-pressure reading, without diagnosis of hypertension: Secondary | ICD-10-CM | POA: Diagnosis not present

## 2017-07-05 DIAGNOSIS — F39 Unspecified mood [affective] disorder: Secondary | ICD-10-CM | POA: Diagnosis not present

## 2017-07-05 DIAGNOSIS — G2589 Other specified extrapyramidal and movement disorders: Secondary | ICD-10-CM

## 2017-07-05 DIAGNOSIS — Z23 Encounter for immunization: Secondary | ICD-10-CM

## 2017-07-05 NOTE — Progress Notes (Deleted)
   Subjective:    Patient ID: Isaac Myers, male    DOB: Feb 08, 1991, 27 y.o.   MRN: [id number]  No chief complaint on file.    HPI:  Isaac Perkinglmer Roblero Myers is a 27 y.o. male who presents today for follow up of latent tuberculosis.  Isaac Myers was started on Rifampin about 1 month ago. Reports taking the medications as prescribed with no adverse side effects and is tolerating the medication well. Denies any fevers, chills, night sweats, or weight loss.    No Known Allergies    Outpatient Medications Prior to Visit  Medication Sig Dispense Refill  . DULoxetine (CYMBALTA) 30 MG capsule Take 1 capsule (30 mg total) by mouth daily. 30 capsule 3  . meloxicam (MOBIC) 15 MG tablet One tab PO qAM with breakfast for 2 weeks, then daily prn pain. 30 tablet 3  . rifampin (RIFADIN) 300 MG capsule Take 2 capsules (600 mg total) by mouth daily. 60 capsule 3   No facility-administered medications prior to visit.      Past Medical History:  Diagnosis Date  . Chronic back pain   . Chronic midline thoracic back pain 05/25/2017  . Elevated ALT measurement 05/30/2017  . Positive PPD 05/29/2017     Past Surgical History:  Procedure Laterality Date  . TRIGGER POINT INJECTION     back       Review of Systems    Objective:    There were no vitals taken for this visit. Nursing note and vital signs reviewed.  Physical Exam     Assessment & Plan:   Problem List Items Addressed This Visit    None       I am having Isaac Myers maintain his meloxicam, DULoxetine, and rifampin.   No orders of the defined types were placed in this encounter.    Follow-up: No Follow-up on file.   Marcos EkeGreg Calone, MSN, [facility]FNP-C [facility]

## 2017-07-05 NOTE — Progress Notes (Signed)
HPI:                                                                Isaac Myers is a 27 y.o. male who presents to [facility] Primary Care Sports Medicine today for anxiety/depression follow-up  Interpreter [phone], Renea Ee for nurse portion of visit Wife acting as interpreter for remainder of the visit per patient preference.  Back pain: unchanged, daily, persistent midline thoracic back pain. Meloxicam and Cymbalta does not help. He had a normal MRI last month. He was evaluated by Sports and referred to physical therapy on 06/11/2017. He has an appointment for PT coming up.  Depression/Anxiety: started on Cymbalta 30 mg daily by sports 3 weeks ago. Since that time he has had erectile dysfunction. Has noted that he feels more calm. It has not helped with back pain. Wishes to discontinue the medication due to sexual dysfunction. Denies symptoms of mania/hypomania. Denies suicidal thinking. Denies auditory/visual hallucinations.   TB: currently followed by ID and taking Rifampin.  Depression screen [facility] 2/9 07/05/2017 06/11/2017 06/04/2017 05/25/2017  Decreased Interest 3 2 0 0  Down, Depressed, Hopeless 3 3 0 0  PHQ - 2 Score 6 5 0 0  Altered sleeping 3 3 - -  Tired, decreased energy 3 3 - -  Change in appetite 0 0 - -  Feeling bad or failure about yourself  0 0 - -  Trouble concentrating 0 0 - -  Moving slowly or fidgety/restless 0 0 - -  Suicidal thoughts 0 0 - -  PHQ-9 Score 12 11 - -    GAD 7 : Generalized Anxiety Score 07/05/2017 06/11/2017  Nervous, Anxious, on Edge 3 2  Control/stop worrying 0 3  Worry too much - different things 0 3  Trouble relaxing 3 2  Restless 0 3  Easily annoyed or irritable 0 3  Afraid - awful might happen 0 0  Total GAD 7 Score 6 16      Past Medical History:  Diagnosis Date  . Chronic back pain   . Chronic midline thoracic back pain 05/25/2017  . Elevated ALT measurement 05/30/2017  . Positive PPD 05/29/2017   Past  Surgical History:  Procedure Laterality Date  . TRIGGER POINT INJECTION     back   Social History   Tobacco Use  . Smoking status: Never Smoker  . Smokeless tobacco: Never Used  Substance Use Topics  . Alcohol use: Yes   family history includes Diabetes in his mother.    ROS: negative except as noted in the HPI  Medications: Current Outpatient Medications  Medication Sig Dispense Refill  . rifampin (RIFADIN) 300 MG capsule Take 2 capsules (600 mg total) by mouth daily. 60 capsule 3   No current facility-administered medications for this visit.    No Known Allergies     Objective:  BP (!) 135/91   Pulse 80   Wt 169 lb (76.7 kg)   BMI 27.28 kg/m  Gen:  alert, not ill-appearing, no distress, appropriate for age HEENT: head normocephalic without obvious abnormality, conjunctiva and cornea clear, trachea midline Pulm: Normal work of breathing, normal phonation  Neuro: alert and oriented x 3, no tremor MSK: extremities atraumatic, normal gait and station Skin: intact, no rashes  on exposed skin, no jaundice, no cyanosis Psych: well-groomed, cooperative, good eye contact, depressed mood, affect mood-congruent, speech is articulate, and thought processes clear and goal-directed    No results found for this or any previous visit (from the past 72 hour(s)). No results found.    Assessment and Plan: 27 y.o. male with   1. Mood disorder (HCC) - PHQ9=12, no acute safety issues. His anxiety did improve with Cymbalta, but unfortunately he has intolerable sexual side effects. He wishes to discontinue SNRI and does not want to trial any other medication at this time  2. Scapular dyskinesis - discontinuing Meloxicam, no improvement in pain with daily NSAID, risk outweighs benefit. - planned PT - keep follow-ups with Sports Medicine  3. Elevated blood pressure reading BP Readings from Last 3 Encounters:  07/05/17 (!) 135/91  06/11/17 122/73  06/04/17 135/85  - BP out  of range last 2/3 appointments - no additional CVD risk factors - therapeutic lifestyle changes and active surveillance   4. Need for immunization against influenza - Flu Vaccine QUAD 36+ mos IM  Patient education and anticipatory guidance given Patient agrees with treatment plan Follow-up in 8 weeks for mood or sooner as needed if symptoms worsen or fail to improve  Levonne Hubertharley E. Ilisha Blust PA-C

## 2017-07-09 ENCOUNTER — Ambulatory Visit: Payer: BLUE CROSS/BLUE SHIELD | Admitting: Physician Assistant

## 2017-07-12 ENCOUNTER — Encounter: Payer: Self-pay | Admitting: Rehabilitative and Restorative Service Providers"

## 2017-07-12 ENCOUNTER — Ambulatory Visit (INDEPENDENT_AMBULATORY_CARE_PROVIDER_SITE_OTHER): Payer: BLUE CROSS/BLUE SHIELD | Admitting: Rehabilitative and Restorative Service Providers"

## 2017-07-12 DIAGNOSIS — M546 Pain in thoracic spine: Secondary | ICD-10-CM | POA: Diagnosis not present

## 2017-07-12 DIAGNOSIS — R293 Abnormal posture: Secondary | ICD-10-CM | POA: Diagnosis not present

## 2017-07-12 DIAGNOSIS — R29898 Other symptoms and signs involving the musculoskeletal system: Secondary | ICD-10-CM

## 2017-07-12 NOTE — Patient Instructions (Signed)
Scapula Adduction With Pectoralis Stretch: Low - Standing   Shoulders at 45 hands even with shoulders, keeping weight through legs, shift weight forward until you feel pull or stretch through the front of your chest. Hold _30__ seconds. Do _3__ times, _2-4__ times per day.   Scapula Adduction With Pectoralis Stretch: Mid-Range - Standing   Shoulders at 90 elbows even with shoulders, keeping weight through legs, shift weight forward until you feel pull or strength through the front of your chest. Hold __30_ seconds. Do _3__ times, __2-4_ times per day.   Scapula Adduction With Pectoralis Stretch: High - Standing   Shoulders at 120 hands up high on the doorway, keeping weight on feet, shift weight forward until you feel pull or stretch through the front of your chest. Hold _30__ seconds. Do _3__ times, _2-3__ times per day.   Axial Extension (Chin Tuck)    Pull chin in and lengthen back of neck. Hold __5__ seconds while counting out loud. Repeat __10__ times. Do __several__ sessions per day.  Shoulder Blade Squeeze   Swim noodle  Rotate shoulders back, then squeeze shoulder blades down and back . Hold 10 sec Repeat _10___ times. Do __several __ sessions per day.  Upper Back Strength: Lower Trapezius / Rotator Cuff " L's "     Arms in waitress pose, palms up. Press hands back and slide shoulder blades down and back  Hold for __5__ seconds. Repeat _10___ times. 1-2 times per day.    Scapular Retraction: Elbow Flexion (Standing)  "W's"     With elbows bent to 90, pinch shoulder blades together and rotate arms out, keeping elbows bent. Repeat __10__ times per set. Do __1-2__ sets per session. Do _several ___ sessions per day.  TENS UNIT: This is helpful for muscle pain and spasm.   Search and Purchase a TENS 7000 2nd edition at www.tenspros.com. It should be less than $30.     TENS unit instructions: Do not shower or bathe with the unit on Turn the unit off  before removing electrodes or batteries If the electrodes lose stickiness add a drop of water to the electrodes after they are disconnected from the unit and place on plastic sheet. If you continued to have difficulty, call the TENS unit company to purchase more electrodes. Do not apply lotion on the skin area prior to use. Make sure the skin is clean and dry as this will help prolong the life of the electrodes. After use, always check skin for unusual red areas, rash or other skin difficulties. If there are any skin problems, does not apply electrodes to the same area. Never remove the electrodes from the unit by pulling the wires. Do not use the TENS unit or electrodes other than as directed. Do not change electrode placement without consultating your therapist or physician. Keep 2 fingers with between each electrode.      [facility] [address] [place], [place] [address]  [phone] (office) [phone] (fax)

## 2017-07-12 NOTE — Therapy (Addendum)
[facility] [place], [place], [address] Phone: [phone]   Fax:  [phone]  Physical Therapy Evaluation  Patient Details  Name: Isaac Myers MRN: [id number] Date of Birth: 1991/01/26 Referring Provider: Dr Dianah Field    Encounter Date: 07/12/2017  PT End of Session - 07/12/17 1620    Visit Number  1    Number of Visits  12    Date for PT Re-Evaluation  08/23/17    PT Start Time  1620    PT Stop Time  1729    PT Time Calculation (min)  69 min    Activity Tolerance  Patient tolerated treatment well       Past Medical History:  Diagnosis Date  . Chronic back pain   . Chronic midline thoracic back pain 05/25/2017  . Elevated ALT measurement 05/30/2017  . Positive PPD 05/29/2017    Past Surgical History:  Procedure Laterality Date  . TRIGGER POINT INJECTION     back    There were no vitals filed for this visit.   Subjective Assessment - 07/12/17 1634    Subjective  Patient has had pain in the mid back area for the past two years. He was seen by chiropractor with no improvement.     Pertinent History  TB - treated with meds     Patient Stated Goals  get rid of pain     Currently in Pain?  Yes    Pain Score  8     Pain Location  Back    Pain Orientation  Mid;Right;Left    Pain Descriptors / Indicators  Numbness    Pain Type  Chronic pain    Pain Radiating Towards  shoulder baldes     Pain Onset  More than a month ago    Pain Frequency  Intermittent almost constant     Aggravating Factors   lifting heavy things     Pain Relieving Factors  get up and walk          [facility] PT Assessment - 07/12/17 0001      Assessment   Medical Diagnosis  Scapular dyskinesis     Referring Provider  Dr Dianah Field     Onset Date/Surgical Date  05/16/15    Hand Dominance  Right    Next MD Visit  5/19    Prior Therapy  chiropractic       Balance Screen   Has the patient fallen in the past 6  months  No    Has the patient had a decrease in activity level because of a fear of falling?   No    Is the patient reluctant to leave their home because of a fear of falling?   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       Posture/Postural Control   Posture/Postural Control  -- poor scapular control with elevation - scap abd/rot bilat     Postural Limitations  Rounded Shoulders;Forward head;Increased thoracic kyphosis    Posture Comments  head forward; shoudlers rounded and elevated; scapulae abducted and rotated along the thoracic spine       AROM   Overall AROM Comments  end range limitations bilat shoulders; tight through cervical  spine       Strength   Overall Strength Comments  5/5 bilat UE's except middle and lower traps 5-/5       Palpation   Spinal mobility  hypobile thoracic spine with CPA and lat mobs     Palpation comment  tight bilat pecs; ant/lat/post cervical musculature; upper traps; teres              Objective measurements completed on examination: See above findings.      [place] Adult PT Treatment/Exercise - 07/12/17 0001      Therapeutic Activites    Therapeutic Activities  -- myofacial ball release work thoracic posterior/anterior       Neuro Re-ed    Neuro Re-ed Details   postural correction - engaging posterior shoulder girdle musculature       Shoulder Exercises: Standing   Other Standing Exercises  axial extrension 10 sec x 10; scap squeeze 10 sec x 10; L's x 10; W's x 10 with swim noodle      Shoulder Exercises: Stretch   Other Shoulder Stretches  3 way doorway stretch 30 sec x 2 each    Other Shoulder Stretches  prolonged snow angel 2-3 min UE's at ~ 80-85 deg       Moist Heat Therapy   Number Minutes Moist Heat  20 Minutes    Moist Heat  Location  -- thoracic      Electrical Stimulation   Electrical Stimulation Location  bilat mid thoracic - medial scapular area     Electrical Stimulation Action  IFC    Electrical Stimulation Parameters  to tolerance     Electrical Stimulation Goals  Pain;Tone             PT Education - 07/12/17 1657    Education provided  Yes    Education Details  HEP Tens     Person(s) Educated  Patient    Methods  Explanation;Demonstration;Tactile cues;Verbal cues;Handout    Comprehension  Verbalized understanding;Returned demonstration;Verbal cues required;Tactile cues required          PT Long Term Goals - 07/12/17 1743      PT LONG TERM GOAL #1   Title  Improve posture and alignment with patient to demonstrate upright posture with posterior shoulder girdle musculature engaged 08/23/17    Time  6    Period  Weeks    Status  New      PT LONG TERM GOAL #2   Title  Decrease pain in thoracic spine and scapulae area by 50-75% allowing patient to work with less pain on a daily basis 08/23/17    Time  6    Period  Weeks    Status  New      PT LONG TERM GOAL #3   Title  Improve scapular balance for elevation of shoulders - improving scapular rhythm with decreased scapular dyskinesis 08/23/17    Time  6    Period  Weeks    Status  New      PT LONG TERM GOAL #4   Title  Independent in HEP 08/23/17    Time  6    Period  Weeks    Status  New      PT LONG TERM GOAL #5   Title  Improve FOTO to </= 24% limitation 08/23/17    Time  6    Period  Weeks    Status  New  Plan - 07/12/17 1726    Clinical Impression Statement  Isaac Myers presents with 2 year history of mid back pain radiating into the shoulder blade area bilaterally. Signs and symptoms are consistent with scapular dyskinesis. He has very poor posture with head forward/increased thoracic kyphosis; scapulae abducted and rotated along the thoracic wall; head of the humerus anterior in orientation. He has decreased  spinal mobility; decreased end range shoulder mobility/ROM; pain with palpation through the thoracic spine and parascapular musculature; pain on a daily basis. patient will benefit from PT to address porblems identified.     Clinical Presentation  Stable    Clinical Decision Making  Low    Rehab Potential  Good    Clinical Impairments Affecting Rehab Potential  chronic pain ~ 2 years     PT Frequency  2x / week    PT Duration  6 weeks    PT Treatment/Interventions  Patient/family education;ADLs/Self Care Home Management;Cryotherapy;Electrical Stimulation;Iontophoresis 82m/ml Dexamethasone;Moist Heat;Ultrasound;Dry needling;Manual techniques;Neuromuscular re-education;Therapeutic activities;Therapeutic exercise    PT Next Visit Plan  review HEP; thoracic mobs/soft tissue work; thoracic extension; supine trunk rotation from snow angel position; work on posterior shoulder girdle strengthening as pecs are elongated to restore muscular balance; modalities as indicated     Consulted and Agree with Plan of Care  Patient       Patient will benefit from skilled therapeutic intervention in order to improve the following deficits and impairments:  Postural dysfunction, Improper body mechanics, Pain, Increased muscle spasms, Increased fascial restricitons, Decreased range of motion, Decreased mobility, Decreased activity tolerance, Hypomobility  Visit Diagnosis: Pain in thoracic spine - Plan: PT plan of care cert/re-cert  Other symptoms and signs involving the musculoskeletal system - Plan: PT plan of care cert/re-cert  Abnormal posture - Plan: PT plan of care cert/re-cert     Problem List Patient Active Problem List   Diagnosis Date Noted  . Mood disorder ([place] 06/11/2017  . Elevated ALT measurement 05/30/2017  . Positive PPD 05/29/2017  . Scapular dyskinesis 05/25/2017    Mamie Hundertmark PNilda SimmerPT, MPH  07/12/2017, 5:56 PM  [facility] [address] [place] [address]Phone: [phone]  Fax:  [phone] Name: EHugh KamaraMRN: [id number] of Birth: 7Sep 08, 1992 PHYSICAL THERAPY DISCHARGE SUMMARY  Visits from Start of Care: Evaluation only   Current functional level related to goals / functional outcomes: Unchanged    Remaining deficits: Unknown one visit only    Education / Equipment: HEP  Plan: Patient agrees to discharge.  Patient goals were not met. Patient is being discharged due to not returning since the last visit.  ?????     Stepen Prins P. HHelene KelpPT, MPH 08/22/17 4:50 PM

## 2017-07-15 DIAGNOSIS — R030 Elevated blood-pressure reading, without diagnosis of hypertension: Secondary | ICD-10-CM | POA: Insufficient documentation

## 2017-07-20 ENCOUNTER — Encounter: Payer: BLUE CROSS/BLUE SHIELD | Admitting: Rehabilitative and Restorative Service Providers"

## 2017-10-19 ENCOUNTER — Ambulatory Visit (INDEPENDENT_AMBULATORY_CARE_PROVIDER_SITE_OTHER): Payer: BLUE CROSS/BLUE SHIELD | Admitting: Physician Assistant

## 2017-10-19 ENCOUNTER — Encounter: Payer: Self-pay | Admitting: Physician Assistant

## 2017-10-19 VITALS — BP 140/89 | HR 73 | Temp 98.2°F | Wt 167.0 lb

## 2017-10-19 DIAGNOSIS — B351 Tinea unguium: Secondary | ICD-10-CM | POA: Diagnosis not present

## 2017-10-19 DIAGNOSIS — R300 Dysuria: Secondary | ICD-10-CM | POA: Insufficient documentation

## 2017-10-19 LAB — POCT URINALYSIS DIPSTICK
Bilirubin, UA: NEGATIVE
Blood, UA: NEGATIVE
Glucose, UA: NEGATIVE
Ketones, UA: NEGATIVE
LEUKOCYTES UA: NEGATIVE
NITRITE UA: NEGATIVE
PH UA: 7 (ref 5.0–8.0)
PROTEIN UA: NEGATIVE
Spec Grav, UA: 1.02 (ref 1.010–1.025)
UROBILINOGEN UA: 0.2 U/dL

## 2017-10-19 MED ORDER — TERBINAFINE HCL 250 MG PO TABS: 250.0000 mg | ORAL_TABLET | Freq: Every day | ORAL | 0 refills | Status: DC

## 2017-10-19 MED ORDER — CIPROFLOXACIN HCL 500 MG PO TABS: 500.0000 mg | ORAL_TABLET | Freq: Two times a day (BID) | ORAL | 0 refills | Status: AC

## 2017-10-19 NOTE — Patient Instructions (Signed)
Contact Infectious Disease to schedule follow-up appointment for TB [phone]  Take Ciprofloxacin (antibiotic) twice a day for 2 weeks to treat the burning with urination.  After you complete the antibiotic, okay to start Terbinafine (antifungal) to treat the toenail infection   Prostatitis (Prostatitis) La prostatitis es el enrojecimiento, dolor e hinchazn (inflamacin) de la glndula prosttica. La glndula prosttica tiene el tamao de [place] nuez y se encuentra justo debajo de la vejiga. CUIDADOS EN EL HOGAR  Tome los Estée Laudermedicamentos como le indic el mdico.  Tome baos de agua caliente (baos de asiento) segn las indicaciones del mdico.  SOLICITE AYUDA SI:  Los sntomas empeoran en lugar de Scientist, clinical (histocompatibility and immunogenetics)mejorar.  Tiene fiebre.  SOLICITE AYUDA DE INMEDIATO SI:  Tiene escalofros.  Tiene malestar estomacal (nuseas) o vmitos.  Se siente dbil, sufre mareos o se desvanece (se desmaya).  Nota que no puede Copywriter, advertisinghacer pis orinar.  Tiene sangre o grumos de [place]sangre (cogulos)en el pis (orina).  ASEGRESE DE QUE:  Comprende estas instrucciones.  Controlar su afeccin.  Recibir ayuda de inmediato si no mejora o si empeora.  Esta informacin no tiene Theme park managercomo fin reemplazar el consejo del mdico. Asegrese de hacerle al mdico cualquier pregunta que tenga. Document Released: 10/31/2011 Document Revised: 05/22/2014 Document Reviewed: 11/04/2015 Elsevier Interactive Patient Education  2017 Elsevier Inc.   Infeccin por hongos en las uas (Fungal Nail Infection) La infeccin por hongos en las uas es una infeccin por hongos frecuente en las uas de los pies o de las manos. Este trastorno Coca Colaafecta las uas de los pies con ms frecuencia que las uas de las manos. Ms de Neomia Dearuna ua puede infectarse. Esta afeccin puede transmitirse de [place] persona a otra (es  contagiosa). CAUSAS La causa de esta afeccin es un hongo. Existen distintos tipos de hongos que pueden causar la infeccin. Estos hongos son  ms frecuentes en las zonas hmedas y clidas. Si las manos o los pies entran en contacto con los hongos, se pueden introducir en una ruptura de las uas de las manos o de los pies y Games developerproducir la infeccin. FACTORES DE RIESGO Los siguientes factores pueden hacer que usted sea propenso a sufrir esta afeccin:  Ser varn.  Tener diabetes.  Ser Neomia Dearuna persona de edad avanzada.  Convivir con alguien que tiene hongos.  Caminar descalzo en zonas donde proliferan hongos, como duchas o vestuarios.  Tener mala circulacin.  Usar zapatos y calcetines que Visteon Corporationhacen transpirar los pies.  Tener pie de atleta.  Tener una ua lastimada o antecedentes recientes de una ciruga de uas.  Tener psoriasis.  Debilitamiento del sistema de defensa del cuerpo (sistema inmunitario). SNTOMAS Los sntomas de esta afeccin incluyen lo siguiente:  Cyndia DiverUna mancha plida sobre la ua.  Engrosamiento de la ua.  Una ua que se torna amarilla o [place]marrn.  Bordes de las uas rugosos o quebradizos.  Una ua que se cae.  Una ua que se ha desprendido del lecho ungueal. DIAGNSTICO Esta afeccin se diagnostica mediante un examen fsico. El mdico podr tomar una muestra de la ua para examinarla y Engineer, manufacturingdetectar si tiene hongos. TRATAMIENTO Las infecciones leves no necesitan tratamiento. Si tiene Charter Communicationscambios importantes en las uas, el tratamiento puede incluir lo siguiente:  Medicamentos antimicticos por va oral. Deber tomar los medicamentos durante algunas semanas o meses y no ver los resultados hasta despus de un largo Rollatiempo. Estos medicamentos pueden tener efectos secundarios. Consulte al Dow Chemicalmdico sobre los problemas a los que debe estar atento.  Cremas y esmaltes para uas antimicticos.  Se pueden usar junto con los medicamentos antimicticos que se administran por va oral.  Tratamiento lser de las uas.  Ciruga para extirpar la ua. Esto puede ser [employer] casos ms graves de infecciones. El tratamiento  es muy largo y la infeccin Metallurgist. INSTRUCCIONES PARA EL CUIDADO EN EL HOGAR Medicamentos  Tome o aplquese los medicamentos de venta libre y Science writer se lo haya indicado el mdico.  Consulte al mdico sobre el uso de pomadas mentoladas para las uas de [place]. Estilo de vida  No comparta elementos personales como toallas o cortauas.  Crtese las uas con frecuencia.  Lvese y squese las manos y los pies todos [place].  Use calcetines absorbentes y cmbiese los calcetines con frecuencia.  Use un tipo de calzado que permita que el aire [place], como sandalias o zapatillas de lona. Deseche los zapatos viejos.  Use guantes de goma si est trabajando con sus manos en lugares mojados.  No camine descalzo en duchas o vestuarios.  No concurra a un saln de cosmtica de uas si no usan instrumentos limpios.  No use uas artificiales. Instrucciones generales  Concurra a todas las visitas de control como se lo haya indicado el mdico. Esto es importante.  Aplquese polvo antimictico en los pies y en los zapatos. SOLICITE ATENCIN MDICA SI: La infeccin no mejora o si empeora despus de varios meses. Esta informacin no tiene Theme park manager el consejo del mdico. Asegrese de hacerle al mdico cualquier pregunta que tenga. Document Released: 02/08/2005 Document Revised: 08/23/2015 Document Reviewed: 11/02/2014 Elsevier Interactive Patient Education  [employer].

## 2017-10-19 NOTE — Progress Notes (Signed)
HPI:                                                                Isaac Myers is a 27 y.o. male who presents to [facility]: Primary Care Sports Medicine today for dysuria  Spanish speaking patient. Jola Babinski, CMA, acting as interpreter  Dysuria   This is a new problem. The current episode started more than 1 month ago (1 month). The problem occurs every urination. The problem has been unchanged. The quality of the pain is described as burning. There has been no fever. He is sexually active. Associated symptoms include frequency. Pertinent negatives include no chills, discharge, flank pain, hematuria, hesitancy, nausea or vomiting. Associated symptoms comments: + incomplete bladder emptying + suprapubic pain. He has tried nothing for the symptoms. There is no history of recurrent UTIs or a urological procedure.   Has also had itching of his right toes and discoloration/yellowing of his toenails on the right foot. He has tried some OTC topical antifungals, which helped with itching, but not nail discoloration.     Past Medical History:  Diagnosis Date  . Chronic back pain   . Chronic midline thoracic back pain 05/25/2017  . Elevated ALT measurement 05/30/2017  . Positive PPD 05/29/2017   Past Surgical History:  Procedure Laterality Date  . TRIGGER POINT INJECTION     back   Social History   Tobacco Use  . Smoking status: Never Smoker  . Smokeless tobacco: Never Used  Substance Use Topics  . Alcohol use: Yes   family history includes Diabetes in his mother.    ROS: negative except as noted in the HPI  Medications: Current Outpatient Medications  Medication Sig Dispense Refill  . ciprofloxacin (CIPRO) 500 MG tablet Take 1 tablet (500 mg total) by mouth 2 (two) times daily for 14 days. 28 tablet 0  . rifampin (RIFADIN) 300 MG capsule Take 2 capsules (600 mg total) by mouth daily. 60 capsule 3  . terbinafine (LAMISIL) 250 MG tablet Take 1  tablet (250 mg total) by mouth daily. 90 tablet 0   No current facility-administered medications for this visit.    No Known Allergies     Objective:  BP 140/89   Pulse 73   Temp 98.2 F (36.8 C) (Oral)   Wt 167 lb (75.8 kg)   BMI 26.95 kg/m  Gen:  alert, not ill-appearing, no distress, appropriate for age HEENT: head normocephalic without obvious abnormality, conjunctiva and cornea clear, trachea midline Pulm: Normal work of breathing, normal phonation GI: abdomen soft, non-tender, no guarding or rigidity, no palpable masses Neuro: alert and oriented x 3, no tremor MSK: extremities atraumatic, normal gait and station Skin: all five toenails of right foot and left 4th toenail thickened and yellowed,  Psych: well-groomed, cooperative, good eye contact, euthymic mood, affect mood-congruent, speech is articulate, and thought processes clear and goal-directed    Results for orders placed or performed in visit on 10/19/17 (from the past 72 hour(s))  POCT Urinalysis Dipstick     Status: Normal   Collection Time: 10/19/17  2:53 PM  Result Value Ref Range   Color, UA amber    Clarity, UA clear    Glucose, UA Negative Negative   Bilirubin, UA  negative    Ketones, UA negative    Spec Grav, UA 1.020 1.010 - 1.025   Blood, UA negative    pH, UA 7.0 5.0 - 8.0   Protein, UA Negative Negative   Urobilinogen, UA 0.2 0.2 or 1.0 E.U./dL   Nitrite, UA negative    Leukocytes, UA Negative Negative   Appearance     Odor     No results found.    Assessment and Plan: 27 y.o. male with   Dysuria - Plan: POCT Urinalysis Dipstick, Urine Culture, C. trachomatis/N. gonorrhoeae RNA, ciprofloxacin (CIPRO) 500 MG tablet  Onychomycosis of toenail - Plan: terbinafine (LAMISIL) 250 MG tablet  1 month of dysuria, urinary frequency and suprapubic pressure without urethral discharge. UA was negative. Culture and GC/chlamydia pending. Treating empirically for acute prostatitis with Cipro x 2  weeks.  Instructed patient to complete antibiotic therapy before starting his antifungal treatment.   Patient education and anticipatory guidance given Patient agrees with treatment plan Follow-up in 3 months for onychomycosis or sooner as needed if symptoms worsen or fail to improve  Levonne Hubertharley E. Jhamir Pickup PA-C

## 2017-10-20 LAB — C. TRACHOMATIS/N. GONORRHOEAE RNA
C. trachomatis RNA, TMA: NOT DETECTED
N. gonorrhoeae RNA, TMA: NOT DETECTED

## 2017-10-20 LAB — URINE CULTURE
MICRO NUMBER: 90687135
Result:: NO GROWTH
SPECIMEN QUALITY: ADEQUATE

## 2017-10-22 NOTE — Progress Notes (Signed)
Urine culture was normal This does not rule out an infection in the prostate Recommend completing antibiotic therapy with Cipro x 2 weeks Follow-up if symptoms persist after treatment

## 2017-10-23 ENCOUNTER — Ambulatory Visit (INDEPENDENT_AMBULATORY_CARE_PROVIDER_SITE_OTHER): Payer: BLUE CROSS/BLUE SHIELD | Admitting: Family

## 2017-10-23 ENCOUNTER — Encounter: Payer: Self-pay | Admitting: Family

## 2017-10-23 VITALS — BP 125/84 | HR 67 | Temp 98.2°F | Wt 166.0 lb

## 2017-10-23 DIAGNOSIS — Z227 Latent tuberculosis: Secondary | ICD-10-CM

## 2017-10-23 DIAGNOSIS — R7611 Nonspecific reaction to tuberculin skin test without active tuberculosis: Secondary | ICD-10-CM | POA: Diagnosis not present

## 2017-10-23 LAB — COMPREHENSIVE METABOLIC PANEL
AG Ratio: 1.9 (calc) (ref 1.0–2.5)
ALT: 101 U/L — AB (ref 9–46)
AST: 46 U/L — AB (ref 10–40)
Albumin: 4.9 g/dL (ref 3.6–5.1)
Alkaline phosphatase (APISO): 98 U/L (ref 40–115)
BUN: 11 mg/dL (ref 7–25)
CO2: 26 mmol/L (ref 20–32)
Calcium: 9.7 mg/dL (ref 8.6–10.3)
Chloride: 102 mmol/L (ref 98–110)
Creat: 0.91 mg/dL (ref 0.60–1.35)
GLUCOSE: 102 mg/dL — AB (ref 65–99)
Globulin: 2.6 g/dL (calc) (ref 1.9–3.7)
Potassium: 4.3 mmol/L (ref 3.5–5.3)
SODIUM: 137 mmol/L (ref 135–146)
TOTAL PROTEIN: 7.5 g/dL (ref 6.1–8.1)
Total Bilirubin: 0.6 mg/dL (ref 0.2–1.2)

## 2017-10-23 NOTE — Progress Notes (Signed)
Subjective:    Patient ID: Gwenlyn Perking, male    DOB: February 28, 1991, 27 y.o.   MRN: [id number]  Chief Complaint  Patient presents with  . Latent TB    HPI:  Timmothy Baranowski is a 27 y.o. male who presents today for a routine follow up office visit for latent tuberculosis. Mr. Marquies Wanat speaks Spanish as a primary language.   Mr. Kelby Aline was previously seen in the office on 06/14/17 with instructions to follow up in 1 month. He was prescribed Rifampin at the time to treat his suspected latent tuberculosis. He reports taking the Rifampin as described and denies adverse side effects other than changes in color of urine. He completed the course of medication approximately 1 week ago. Does feel the occasional cough that resolved quickly. No fevers, chills, or weight loss.  Does continue to have night sweats that is every night which do not appear temperature related. Finished    No Known Allergies    Outpatient Medications Prior to Visit  Medication Sig Dispense Refill  . ciprofloxacin (CIPRO) 500 MG tablet Take 1 tablet (500 mg total) by mouth 2 (two) times daily for 14 days. 28 tablet 0  . rifampin (RIFADIN) 300 MG capsule Take 2 capsules (600 mg total) by mouth daily. 60 capsule 3  . terbinafine (LAMISIL) 250 MG tablet Take 1 tablet (250 mg total) by mouth daily. 90 tablet 0   No facility-administered medications prior to visit.      Past Medical History:  Diagnosis Date  . Chronic back pain   . Chronic midline thoracic back pain 05/25/2017  . Elevated ALT measurement 05/30/2017  . Positive PPD 05/29/2017     Past Surgical History:  Procedure Laterality Date  . TRIGGER POINT INJECTION     back     Review of Systems  Constitutional: Negative for chills and fever.  Respiratory: Positive for cough. Negative for chest tightness, shortness of breath and wheezing.   Cardiovascular: Negative for chest pain and palpitations.      Objective:    BP 125/84   Pulse  67   Temp 98.2 F (36.8 C) (Oral)   Wt 166 lb (75.3 kg)   BMI 26.79 kg/m  Nursing note and vital signs reviewed.  Physical Exam  Constitutional: He is oriented to person, place, and time. He appears well-developed and well-nourished. No distress.  Cardiovascular: Normal rate, regular rhythm, normal heart sounds and intact distal pulses. Exam reveals no gallop and no friction rub.  No murmur heard. Pulmonary/Chest: Effort normal and breath sounds normal. No stridor. No respiratory distress. He has no wheezes. He has no rales. He exhibits no tenderness.  Neurological: He is alert and oriented to person, place, and time.  Skin: Skin is warm and dry.  Psychiatric: He has a normal mood and affect. His behavior is normal. Judgment and thought content normal.       Assessment & Plan:   Problem List Items Addressed This Visit      Other   Positive PPD - Primary    Mr. Roblero Bravo completed the course of rifamipin with no adverse side effects. He unfortunately did not follow up for labs, so will check his kidney and liver function today. There is no need for additional treatment at this time. Educated regarding signs and symptoms of tuberculosis and importance of seeking treatment if they develop. Happy to see him back as needed.           I  am having Stryker Roblero Bravo maintain his rifampin, terbinafine, and ciprofloxacin.    Follow-up: Return if symptoms worsen or fail to improve.   Marcos EkeGreg Calone, MSN, [facility] LL[facility]NP-C [facility]

## 2017-10-23 NOTE — Patient Instructions (Signed)
Good to see you!  We will check your liver and kidney function today.  If you need to be tested in the future a chest x-ray may be required.  You have been treated for latent tuberculosis.   If you develop symptoms of tuberculosis please seek care - fevers, chills, night sweats, weight loss, productive cough.

## 2017-10-23 NOTE — Assessment & Plan Note (Signed)
Isaac Myers completed the course of rifamipin with no adverse side effects. He unfortunately did not follow up for labs, so will check his kidney and liver function today. There is no need for additional treatment at this time. Educated regarding signs and symptoms of tuberculosis and importance of seeking treatment if they develop. Happy to see him back as needed.

## 2017-10-30 ENCOUNTER — Telehealth: Payer: Self-pay | Admitting: *Deleted

## 2017-10-30 NOTE — Telephone Encounter (Signed)
Per Tammy SoursGreg NP, Called the patient to advise his labs look fine liver is elevated but was same as before and to follow up with PCP as normal.

## 2017-10-30 NOTE — Telephone Encounter (Signed)
-----   Message from Veryl SpeakGregory D Calone, FNP sent at 10/26/2017  8:18 AM EDT ----- Please inform patient that his blood work looks okay. His liver function remains slightly elevated which is where he was prior to treatment. Would recommend follow up with primary care, otherwise no follow up with ID is needed as he completed his treatment.

## 2018-01-21 ENCOUNTER — Ambulatory Visit: Payer: BLUE CROSS/BLUE SHIELD | Admitting: Physician Assistant

## 2018-01-25 ENCOUNTER — Encounter: Payer: Self-pay | Admitting: Physician Assistant

## 2018-01-25 ENCOUNTER — Ambulatory Visit (INDEPENDENT_AMBULATORY_CARE_PROVIDER_SITE_OTHER): Payer: BLUE CROSS/BLUE SHIELD | Admitting: Physician Assistant

## 2018-01-25 ENCOUNTER — Ambulatory Visit (INDEPENDENT_AMBULATORY_CARE_PROVIDER_SITE_OTHER): Payer: BLUE CROSS/BLUE SHIELD

## 2018-01-25 VITALS — BP 147/94 | HR 68 | Temp 98.5°F | Wt 166.0 lb

## 2018-01-25 DIAGNOSIS — R7611 Nonspecific reaction to tuberculin skin test without active tuberculosis: Secondary | ICD-10-CM

## 2018-01-25 DIAGNOSIS — R053 Chronic cough: Secondary | ICD-10-CM

## 2018-01-25 DIAGNOSIS — R740 Nonspecific elevation of levels of transaminase and lactic acid dehydrogenase [LDH]: Secondary | ICD-10-CM

## 2018-01-25 DIAGNOSIS — R7401 Elevation of levels of liver transaminase levels: Secondary | ICD-10-CM | POA: Insufficient documentation

## 2018-01-25 DIAGNOSIS — R05 Cough: Secondary | ICD-10-CM

## 2018-01-25 DIAGNOSIS — Z8611 Personal history of tuberculosis: Secondary | ICD-10-CM

## 2018-01-25 DIAGNOSIS — R040 Epistaxis: Secondary | ICD-10-CM | POA: Diagnosis not present

## 2018-01-25 DIAGNOSIS — Z227 Latent tuberculosis: Secondary | ICD-10-CM | POA: Insufficient documentation

## 2018-01-25 DIAGNOSIS — B351 Tinea unguium: Secondary | ICD-10-CM

## 2018-01-25 MED ORDER — AYR SALINE NASAL NA GEL: 1.0000 "application " | Freq: Two times a day (BID) | NASAL | 0 refills | Status: DC

## 2018-01-25 MED ORDER — OMEPRAZOLE 40 MG PO CPDR: 40.0000 mg | DELAYED_RELEASE_CAPSULE | Freq: Every day | ORAL | 0 refills | Status: DC

## 2018-01-25 MED ORDER — BENZONATATE 200 MG PO CAPS: 200.0000 mg | ORAL_CAPSULE | Freq: Two times a day (BID) | ORAL | 0 refills | Status: DC | PRN

## 2018-01-25 MED ORDER — CETIRIZINE HCL 10 MG PO TABS: 10.0000 mg | ORAL_TABLET | Freq: Every day | ORAL | 11 refills | Status: DC

## 2018-01-25 MED ORDER — OXYMETAZOLINE HCL 0.05 % NA SOLN
1.0000 | NASAL | 0 refills | Status: DC | PRN
Start: 2018-01-25 — End: 2018-10-30

## 2018-01-25 NOTE — Progress Notes (Signed)
HPI:                                                                Isaac Myers is a 27 y.o. male who presents to [facility]: Primary Care Sports Medicine today for cough / nosebleeds  Spanish-speaking patient. [employer] interpreter Darien Ramus [phone]  PMHx of latent TB completed Rifampin 10/2017. Cough x 4-5 months, gradually worsening. It is productive in the morning. Sputum is white and yellow. Endorses rare nightsweats. Denies fever, chills, weight loss, hemoptysis, chest pain, dyspnea He has noticed some blood in his sputum the morning after a nosebleed Cough seems to be random or triggered by drinking a cold soda He is a never smoker, no history Nonsmoker  For the last several weeks has had recurrent left-sided nosebleeds, most recent episode was 1 week ago. Bleeding is spontaneous and resolves with pressure after a couple of minutes  He still has toenail fungus despite treatment with Terbinafine and would like "stronger medication" for this.  Past Medical History:  Diagnosis Date  . Chronic back pain   . Chronic midline thoracic back pain 05/25/2017  . Elevated ALT measurement 05/30/2017  . Positive PPD 05/29/2017   Past Surgical History:  Procedure Laterality Date  . TRIGGER POINT INJECTION     back   Social History   Tobacco Use  . Smoking status: Never Smoker  . Smokeless tobacco: Never Used  Substance Use Topics  . Alcohol use: Yes   family history includes Diabetes in his mother.    ROS: negative except as noted in the HPI  Medications: Current Outpatient Medications  Medication Sig Dispense Refill  . rifampin (RIFADIN) 300 MG capsule Take 2 capsules (600 mg total) by mouth daily. 60 capsule 3  . terbinafine (LAMISIL) 250 MG tablet Take 1 tablet (250 mg total) by mouth daily. 90 tablet 0   No current facility-administered medications for this visit.    No Known Allergies     Objective:  BP (!) 147/94   Pulse 68   Temp 98.5 F (36.9  C) (Oral)   Wt 166 lb (75.3 kg)   SpO2 98%   BMI 26.79 kg/m  Gen:  alert, not ill-appearing, no distress, appropriate for age HEENT: head normocephalic without obvious abnormality, conjunctiva and cornea clear, TM's pearly gray and semi-transparent, nasal mucosa pink, no epistaxis, no nasal polyp, oropharynx clear, neck supple, no cervical adenopathy, trachea midline Pulm: Normal work of breathing, normal phonation, clear to auscultation bilaterally, no wheezes, rales or rhonchi CV: Normal rate, regular rhythm, s1 and s2 distinct, no murmurs, clicks or rubs  Neuro: alert and oriented x 3, no tremor MSK: extremities atraumatic, normal gait and station Skin: intact, no rashes on exposed skin, no jaundice, no cyanosis  No results found for this or any previous visit (from the past 72 hour(s)). No results found.    Assessment and Plan: 27 y.o. male with   .Diagnoses and all orders for this visit:  Cough -     DG Chest 2 View -     CBC with Differential/Platelet -     omeprazole (PRILOSEC) 40 MG capsule; Take 1 capsule (40 mg total) by mouth daily. -     benzonatate (TESSALON) 200 MG capsule; Take 1  capsule (200 mg total) by mouth 2 (two) times daily as needed for cough. -     cetirizine (ZYRTEC) 10 MG tablet; Take 1 tablet (10 mg total) by mouth daily.  Latent tuberculosis  History of treatment for tuberculosis -     DG Chest 2 View  Recurrent epistaxis -     oxymetazoline (AFRIN NASAL SPRAY) 0.05 % nasal spray; Place 1 spray into both nostrils as needed (nosebleed). -     saline (AYR) GEL; Place 1 application into the nose 2 (two) times daily.  Transaminitis -     Hepatic function panel   Cough - afebrile, no tachypnea, no tachycardia, SpO2 98% on RA at rest, no adventitious lung sounds - history and PE suggestive of upper airway cough syndrome - given his history of latent TB we will obtain CXR today, however, I have low suspicion that this is an infectious cause since he  has completed treatment - trial PPI, antihistamine and Tessalon prn. Reassess in 2 weeks  Epistaxis - benign exam, no active epistaxis or polyp. nasal saline gel, humidified air, avoid forceful blowing, Afrin prn for bleeding. Referral to ENT if no improvement  Patient education and anticipatory guidance given Patient agrees with treatment plan Follow-up in 2 weeks or sooner as needed if symptoms worsen or fail to improve  Levonne Hubertharley E. Cummings PA-C

## 2018-01-25 NOTE — Patient Instructions (Addendum)
Hemorragia nasal en los adultos (Nosebleed, Adult) Cuando hay hemorragia nasal, sale sangre de la [place]. Las hemorragias nasales son frecuentes. Por lo general, no son un signo de una afeccin grave. Puede haber una hemorragia nasal cuando un pequeo vaso sanguneo de la nariz comienza a Geophysicist/field seismologistsangrar o si el recubrimiento de la nariz (membrana mucosa) se agrieta. Las causas frecuentes de las hemorragias nasales pueden ser las siguientes:  Environmental consultantAlergias.  Resfros.  Hurgarse la nariz.  Sonarse la nariz con Land O'Lakesdemasiada intensidad.  Una lesin por meterse un objeto en la nariz o recibir un golpe en la Clinical cytogeneticistnariz.  Aire seco o fro. Algunas causas menos frecuentes de las hemorragias nasales incluyen lo siguiente:  Gases txicos.  Algo anormal en la nariz o en los espacios llenos de aire en los huesos de la cara (senos).  Crecimientos en la nariz, como plipos.  Medicamentos o afecciones que enlentecen la coagulacin de la [place].  Ciertas enfermedades o procedimientos que irritan o secan las fosas nasales. INSTRUCCIONES PARA EL CUIDADO EN EL HOGAR Si tiene una hemorragia nasal:  Sintese e incline la cabeza ligeramente hacia adelante.  Utilice una toalla o un pauelo de papel limpios para apretar los orificios nasales debajo de la parte sea de la [place]iz. Despus de 10minutos, suelte la Darene Lamernariz y compruebe si vuelve a Geophysicist/field seismologistsangrar. No quite la presin antes de ese tiempo. Si an hay hemorragia, repita la presin y sostenga durante 10minutos hasta que la hemorragia se detenga.  No coloque pauelos de papel ni gasa en la nariz para detener la hemorragia.  Evite recostarse e inclinar la IT trainercabeza hacia atrs. Eso puede provocar una acumulacin de sangre en la garganta y producir ahogo o tos.  Use un aerosol nasal descongestivo para aliviar una hemorragia nasal como se lo haya indicado el mdico.  No se ponga vaselina ni aceite de vaselina en la nariz. Estos productos pueden gotear dentro de los  pulmones. Despus de una hemorragia nasal:  Evite sonarse la nariz u olfatear durante unas cuantas horas.  No haga esfuerzos, no levante objetos y no doble la cintura para Physicist, medicalagacharse durante varios das. Puede retomar otras actividades normales tan pronto como pueda.  Utilice un aerosol salino o un humidificador como se lo haya indicado el mdico.  La aspirina y los anticoagulantes aumentan la probabilidad de hemorragias. Si les recetan estos medicamentos y sufre de hemorragias nasales: ? Pregntele al mdico si debe dejar de tomar los medicamentos o si debe ajustar la dosis. ? No deje de tomar los medicamentos recomendados por el mdico salvo que este le indique lo contrario.  Si la hemorragia nasal se debe a la sequedad de Computer Sciences Corporationlas membranas mucosas, use un gel o aerosol nasal de solucin salina de H. J. Heinzventa libre. Esto mantendr las mucosas hmedas y permitir que se Midwifecicatricen. Si debe usar un lubricante: ? Elija uno que sea soluble en agua. ? Use solamente la cantidad que necesita y con la frecuencia necesaria. ? No se recueste hasta que hayan transcurrido varias horas despus de usarlo. SOLICITE ATENCIN MDICA SI:  Lance Mussiene fiebre.  Tiene hemorragias nasales frecuentes o con mayor frecuencia que lo habitual.  Aparecen hematomas con mucha facilidad.  Tiene una hemorragia nasal por tener algo metido en la nariz.  Tiene sangrado en la boca.  Vomita o libera una sustancia marrn al toser.  Tiene una hemorragia nasal despus de comenzar un medicamento nuevo. SOLICITE ATENCIN MDICA DE INMEDIATO SI:  Tiene una hemorragia nasal despus de una cada o una lesin en la  cabeza.  Tiene una hemorragia nasal que no desaparece despus de .  Siente que se desvanece o est dbil.  Tiene hemorragias fuera de lo comn en otras partes del cuerpo.  Tiene hematomas fuera de lo comn en otras partes del cuerpo.  Tiene sudoracin.  Vomita sangre. Esta informacin no tiene Microbiologist el consejo del mdico. Asegrese de hacerle al mdico cualquier pregunta que tenga. Document Released: 02/08/2005 Document Revised: 05/22/2014 Document Reviewed: 11/16/2015 Elsevier Interactive Patient Education  2018 [employer].    Tos en los adultos Cough, Adult La tos es un reflejo que despeja la garganta y las vas respiratorias. Toser ayuda a curar y [employer]. Es normal toser a Occupational psychologist, pero si la tos aparece junto con otros sntomas o si dura [employer], puede indicar una enfermedad que necesita [place]. La tos puede durar solo 2 o 3semanas (aguda) o ms de 8semanas (crnica). Cules son las causas? Por lo general, las causas de la tos son las siguientes:  Aspirar sustancias que [employer].  Una infeccin respiratoria de origen viral o bacteriano.  Alergias.  Asma.  Goteo posnasal.  Fumar.  cido que vuelve del estmago hacia el esfago (reflujo gastroesofgico ).  Ciertos medicamentos.  Enfermedades pulmonares crnicas, incluida la EPOC (o rara vez, cncer de pulmn).  Otras afecciones, por ejemplo, insuficiencia cardaca.  Siga estas indicaciones en su casa: Est atento a cualquier cambio en los sntomas. Tome estas medidas para Paramedic las molestias:  Tome los medicamentos solamente como se lo haya indicado el mdico. ? Si le recetaron un antibitico, tmelo como se lo haya indicado el mdico. No deje de tomar los antibiticos aunque comience a Actor. ? Hable con el mdico antes de tomar un medicamento para la tos (antitusivo).  Beba suficiente lquido para Photographer orina clara o de color amarillo plido.  Si el aire est seco, use un vaporizador o humidificador de niebla fra en la habitacin o en la casa para ayudar a aflojar las secreciones.  Evite todo lo que le provoque tos en el trabajo o en su casa.  Si la tos empeora por la noche, pruebe a dormir en posicin semierguida.  Evite el humo del  cigarrillo. Si fuma, deje de hacerlo. Si necesita ayuda para dejar de fumar, consulte al mdico.  Evite la cafena.  Evite el alcohol.  Descanse todo lo que sea necesario.  Comunquese con un mdico si:  Aparecen nuevos sntomas.  Tose y escupe pus.  La tos no mejora despus de 2 o 3semanas o empeora.  No puede controlar la tos con antitusivos y no puede dormir debido a Secretary/administrator.  Siente un dolor que empeora o que no puede controlar con medicamentos.  Tiene fiebre.  Baja de peso sin causa aparente.  Tiene transpiracin nocturna. Solicite ayuda de inmediato si:  Tose y [employer].  Tiene dificultad para respirar.  El [employer] late [place] rpido. Esta informacin no tiene Theme park manager el consejo del mdico. Asegrese de hacerle al mdico cualquier pregunta que tenga. Document Released: 12/07/2010 Document Revised: 08/03/2016 Document Reviewed: 07/08/2014 Elsevier Interactive Patient Education  [employer].

## 2018-01-26 LAB — HEPATIC FUNCTION PANEL
AG Ratio: 1.7 (calc) (ref 1.0–2.5)
ALT: 47 U/L — ABNORMAL HIGH (ref 9–46)
AST: 28 U/L (ref 10–40)
Albumin: 4.7 g/dL (ref 3.6–5.1)
Alkaline phosphatase (APISO): 98 U/L (ref 40–115)
BILIRUBIN DIRECT: 0.1 mg/dL (ref 0.0–0.2)
BILIRUBIN TOTAL: 0.4 mg/dL (ref 0.2–1.2)
Globulin: 2.8 g/dL (calc) (ref 1.9–3.7)
Indirect Bilirubin: 0.3 mg/dL (calc) (ref 0.2–1.2)
Total Protein: 7.5 g/dL (ref 6.1–8.1)

## 2018-01-26 LAB — CBC WITH DIFFERENTIAL/PLATELET
BASOS PCT: 0.1 %
Basophils Absolute: 9 cells/uL (ref 0–200)
Eosinophils Absolute: 221 cells/uL (ref 15–500)
Eosinophils Relative: 2.6 %
HEMATOCRIT: 45.1 % (ref 38.5–50.0)
HEMOGLOBIN: 15.6 g/dL (ref 13.2–17.1)
LYMPHS ABS: 3222 {cells}/uL (ref 850–3900)
MCH: 29.5 pg (ref 27.0–33.0)
MCHC: 34.6 g/dL (ref 32.0–36.0)
MCV: 85.4 fL (ref 80.0–100.0)
MPV: 10.8 fL (ref 7.5–12.5)
Monocytes Relative: 7.3 %
NEUTROS PCT: 52.1 %
Neutro Abs: 4429 cells/uL (ref 1500–7800)
Platelets: 248 10*3/uL (ref 140–400)
RBC: 5.28 10*6/uL (ref 4.20–5.80)
RDW: 12.4 % (ref 11.0–15.0)
Total Lymphocyte: 37.9 %
WBC: 8.5 10*3/uL (ref 3.8–10.8)
WBCMIX: 621 {cells}/uL (ref 200–950)

## 2018-01-27 MED ORDER — TERBINAFINE HCL 250 MG PO TABS: 250.0000 mg | ORAL_TABLET | Freq: Every day | ORAL | 0 refills | Status: DC

## 2018-01-27 NOTE — Addendum Note (Signed)
Addended by: Gena FrayUMMINGS, Daymond Cordts E on: 01/27/2018 03:29 PM   Modules accepted: Orders

## 2018-03-26 ENCOUNTER — Encounter: Payer: Self-pay | Admitting: Physician Assistant

## 2018-03-26 ENCOUNTER — Ambulatory Visit (INDEPENDENT_AMBULATORY_CARE_PROVIDER_SITE_OTHER): Payer: BLUE CROSS/BLUE SHIELD | Admitting: Physician Assistant

## 2018-03-26 VITALS — BP 118/77 | HR 66 | Temp 98.4°F | Wt 168.0 lb

## 2018-03-26 DIAGNOSIS — G44319 Acute post-traumatic headache, not intractable: Secondary | ICD-10-CM

## 2018-03-26 DIAGNOSIS — F39 Unspecified mood [affective] disorder: Secondary | ICD-10-CM | POA: Diagnosis not present

## 2018-03-26 DIAGNOSIS — R454 Irritability and anger: Secondary | ICD-10-CM

## 2018-03-26 DIAGNOSIS — N3281 Overactive bladder: Secondary | ICD-10-CM | POA: Diagnosis not present

## 2018-03-26 DIAGNOSIS — R350 Frequency of micturition: Secondary | ICD-10-CM | POA: Insufficient documentation

## 2018-03-26 DIAGNOSIS — Z23 Encounter for immunization: Secondary | ICD-10-CM | POA: Diagnosis not present

## 2018-03-26 DIAGNOSIS — H5712 Ocular pain, left eye: Secondary | ICD-10-CM

## 2018-03-26 LAB — POCT URINALYSIS DIPSTICK
Bilirubin, UA: NEGATIVE
GLUCOSE UA: NEGATIVE
Ketones, UA: NEGATIVE
LEUKOCYTES UA: NEGATIVE
Nitrite, UA: NEGATIVE
PH UA: 7 (ref 5.0–8.0)
Protein, UA: NEGATIVE
RBC UA: NEGATIVE
Spec Grav, UA: 1.025 (ref 1.010–1.025)
Urobilinogen, UA: 0.2 E.U./dL

## 2018-03-26 MED ORDER — MIRABEGRON ER 25 MG PO TB24: 25.0000 mg | ORAL_TABLET | Freq: Every day | ORAL | 3 refills | Status: DC

## 2018-03-26 MED ORDER — DIVALPROEX SODIUM 250 MG PO DR TAB: 250.0000 mg | DELAYED_RELEASE_TABLET | Freq: Two times a day (BID) | ORAL | 1 refills | Status: DC

## 2018-03-26 NOTE — Progress Notes (Signed)
HPI:                                                                Isaac Myers is a 27 y.o. male who presents to [facility] today for multiple concerns  Patient's significant other is serving as interpreter. Patient declines certified interpreter.  Urinary frequency/urgency: reports "every time he drinks he feels the urge to urinate but only a little bit of urine comes out." He voids about every 90-120 minutes. He has noticed an odor and some white particles. Endorses sensation of incomplete bladder emptying. Denies dysuria, hematuria, urethral discharge.  He was treated empirically for possible acute prostatitis in June 2019 with Cipro twice a day for 2 weeks. His urine culture and GC/Chlamydia testing were both negative. He reports he had no change in his symptoms after completing antibiotic therapy. He typically drinks 1 cup of regular coffee and 1 can of soda per day; otherwise drinks only water  He also reports new onset headaches 2-3 days per week for the last 3 weeks, often last the whole day, usually right-sided occipital region, no associated neck pain/stiffness. Pain is described as pulsing. He gets partial relief with Tylenol.  Denies vision change, photophobia, diplopia, nausea/vomiting, dizziness, lightheadedness, paresthesias gait disturbance. Denies prior history of headache disorder or head trauma. At the end of the visit, his partner/girlfriend pulled me aside to tell me that Isaac Myers purposefully struck his head repeatedly against his closet door 3 weeks ago until he lost consciousness. She reports he was unconscious for over 30 minutes. She called EMS and [facility] refused transport to the [facility]. He developed headaches shortly after this incident.  Partner/girlfriend also reports that she is concerned about Isaac Myers's mood and behavior. She states he frequently becomes extremely angry and self-destructive. This has been a problem  for the last year. He has never physically hurt her, but he will yell and break things in the home. She states after these outbursts he will fall asleep and will either not remember or deny remembering what happened.  I have previously tried treating Isaac Myers for depression with Cymbalta. Medication was discontinued due to sexual side effects in Feb 2019. He declined starting an alternative medication at that time. In the time I have seen Isaac Myers in the office this year he has had a very flat affect, at times was irritable and has poor eye contact.  Also c/o intermittent left eye pain x 3 weeks. Pain lasts about 1 hour. Pain is described as deep pressure in the eye. No eye redness, eyelid swelling/redness, epiphora, drainage, vision change. He does not wear corrective lenses and has never seen an eye doctor.    Past Medical History:  Diagnosis Date  . Chronic back pain   . Chronic midline thoracic back pain 05/25/2017  . Elevated ALT measurement 05/30/2017  . Positive PPD 05/29/2017   Past Surgical History:  Procedure Laterality Date  . TRIGGER POINT INJECTION     back   Social History   Tobacco Use  . Smoking status: Never Smoker  . Smokeless tobacco: Never Used  Substance Use Topics  . Alcohol use: Yes   family history includes Diabetes in his mother.    ROS: negative except as noted in  the HPI  Medications: Current Outpatient Medications  Medication Sig Dispense Refill  . cetirizine (ZYRTEC) 10 MG tablet Take 1 tablet (10 mg total) by mouth daily. 30 tablet 11  . omeprazole (PRILOSEC) 40 MG capsule Take 1 capsule (40 mg total) by mouth daily. 30 capsule 0  . oxymetazoline (AFRIN NASAL SPRAY) 0.05 % nasal spray Place 1 spray into both nostrils as needed (nosebleed). 30 mL 0  . saline (AYR) GEL Place 1 application into the nose 2 (two) times daily.  0  . terbinafine (LAMISIL) 250 MG tablet Take 1 tablet (250 mg total) by mouth daily. 90 tablet 0  . divalproex (DEPAKOTE) 250 MG DR  tablet Take 1 tablet (250 mg total) by mouth 2 (two) times daily. 60 tablet 1  . mirabegron ER (MYRBETRIQ) 25 MG TB24 tablet Take 1 tablet (25 mg total) by mouth daily. 30 tablet 3   No current facility-administered medications for this visit.    No Known Allergies     Objective:  BP 118/77   Pulse 66   Temp 98.4 F (36.9 C) (Oral)   Wt 168 lb (76.2 kg)   BMI 27.12 kg/m  Gen: well-groomed, not ill-appearing, no acute distress HEENT: head normocephalic, atraumatic; conjunctiva and cornea clear, no proptosis, normal eyelid, benign fundoscopic exam; neck supple, no meningeal signs Pulm: Normal work of breathing, normal phonation Neuro:  cranial nerves II-XII intact, no nystagmus, normal finger-to-nose, normal heel-to-shin, negative pronator drift, normal rapid alternating movements, normal tone, no tremor MSK: strength 5/5 and symmetric in bilateral upper and lower extremities, normal gait and station, negative Romberg Mental Status: alert and oriented x 3, speech articulate, flat affect, downward gaze / poor eye contact, poor insight     Results for orders placed or performed in visit on 03/26/18 (from the past 72 hour(s))  POCT Urinalysis Dipstick     Status: Normal   Collection Time: 03/26/18  3:52 PM  Result Value Ref Range   Color, UA yellow    Clarity, UA clear    Glucose, UA Negative Negative   Bilirubin, UA negative    Ketones, UA negative    Spec Grav, UA 1.025 1.010 - 1.025   Blood, UA negative    pH, UA 7.0 5.0 - 8.0   Protein, UA Negative Negative   Urobilinogen, UA 0.2 0.2 or 1.0 E.U./dL   Nitrite, UA negative    Leukocytes, UA Negative Negative   Appearance     Odor     No results found.    Assessment and Plan: 27 y.o. male with   .[name] was seen today for headache and urinary frequency.  Diagnoses and all orders for this visit:  Urinary frequency -     POCT Urinalysis Dipstick -     mirabegron ER (MYRBETRIQ) 25 MG TB24 tablet; Take 1 tablet (25  mg total) by mouth daily. -     Ambulatory referral to Urology  OAB (overactive bladder) -     mirabegron ER (MYRBETRIQ) 25 MG TB24 tablet; Take 1 tablet (25 mg total) by mouth daily. -     Ambulatory referral to Urology  Acute post-traumatic headache, not intractable -     MR Brain Wo Contrast; Future  Mood disorder (HCC) -     divalproex (DEPAKOTE) 250 MG DR tablet; Take 1 tablet (250 mg total) by mouth 2 (two) times daily.  Need for immunization against influenza -     Flu Vaccine QUAD 36+ mos IM   Urinary frequency:  POC UA negative today. Prior UA and cx unremarkable. Symptoms are consistent with OAB. Counseled on general measures, avoiding irritating food/beverages, particularly caffeine. Starting Myrbetriq. Referral paced to Urology for follow-up  Acute headache: following closed head injury 3 weeks ago. Reassuring neuro exam. Symptoms consistent with postconcussive syndrome. Ordering MR Brain to r/o hemorrhage  Mood disorder/irritability and anger: given co-morbid headaches, starting Depakote 250 mg bid  Acute left eye pain: no red flag symptoms. MR brain pending due to recent head trauma. Referral placed to Oph  Patient education and anticipatory guidance given Patient agrees with treatment plan Follow-up in 1 month for headaches/mood or sooner as needed if symptoms worsen or fail to improve  Levonne Hubertharley E. Edit Ricciardelli PA-C

## 2018-03-26 NOTE — Patient Instructions (Addendum)
Conmocin cerebral - Adultos (Concussion, Adult) Una conmocin es una lesin cerebral. Las causas pueden ser:  Un golpe en la cabeza.  Un movimiento rpido y brusco (sacudida) de la cabeza o el cuello. Generalmente no pone en peligro la vida. Sin embargo, puede causar problemas graves. Si sufri una conmocin cerebral antes, puede tener sntomas similares a la conmocin despus de un golpe en la cabeza. CUIDADOS EN EL HOGAR Instrucciones generales  Siga cuidadosamente las indicaciones del mdico.  Tome los medicamentos como le indic el mdico.  Solo tome los medicamentos que su mdico considere seguros.  No beba alcohol hasta que el mdico lo autorice. El alcohol y algunos medicamentos pueden hacer ms lenta la curacin. Tambin pueden ponerlo en riesgo de sufrir otras lesiones.  Si tiene dificultad para recordar las cosas, escrbalas.  Trate de hacer una cosa por vez si se distrae con facilidad. Por ejemplo, no mire televisin mientras prepara la cena.  Consulte con familiares y amigos si debe tomar decisiones importantes.  Concurra a las consultas de control con el mdico, segn las indicaciones.  Observe sus sntomas. Dgale a los dems que hagan lo mismo. En algunos casos, despus de una conmocin cerebral surgen problemas graves. Es ms probable que Limited Brandsestos problemas graves los sufran los adultos [place]ayores.  Informe a los [address]estros, el departamento de enfermera de la escuela, el consejero escolar, Public affairs consultantel entrenador o director acerca de su conmocin cerebral. Hgales saber lo que puede y lo que no Scientist, product/process developmentpuede hacer. Ellos debern observarlo para ver si: ? Tiene ms dificultad para prestar atencin o concentrarse. ? Le cuesta an ms recordar las cosas o aprender cosas nuevas. ? Necesita ms tiempo que lo habitual para finalizar las tareas. ? Est ms molesto (irritable) que antes. ? Tampoco puede Charity fundraisercontrolar el estrs. ? Tiene ms problemas que antes.  Haga reposo. Asegrese de  que: ? Duerme bien por la noche. ? Se va a dormir temprano. ? Acustese CarMaxtodos los das a la misma hora aproximadamente. Trate de despertarse a la misma hora. ? Descanse Administratordurante el da. ? Tome una siesta cuando se sienta cansado.  Limite las actividades que requieran mucha concentracin. Estas pueden ser: ? Hacer la tarea para Advice workerel hogar. ? Hacer tareas relacionadas con un trabajo. ? Mirar televisin. ? Usar la computadora. Regreso a las State Street Corporationactividades habituales Regrese a las actividades habituales gradualmente, no [place]quiera hacer todo de Building control surveyoruna vez. Debe darles al cuerpo y el cerebro su tiempo para recuperarse.  No practique deportes ni realice otras actividades deportivas hasta que el mdico lo autorice.  Consulte a su mdico cundo puede andar en bicicleta o conducir otros vehculos o mquinas. Nunca haga estas cosas si se siente mareado.  Pregunte a su mdico cundo podr volver a la escuela o al Aleen Campitrabajo. Prevencin de otra conmocin cerebral Es muy importante que evite otra lesin cerebral, especialmente antes de que se haya recuperado. En casos raros, una nueva lesin puede causar daos cerebrales permanentes, hinchazn del cerebro o la muerte. El riesgo es mayor durante los primeros 7 a 10das despus de una lesin. Evite las lesiones:  Use el cinturn de seguridad al conducir un automvil.  Evite beber alcohol en exceso.  Evite las actividades que puedan favorecer una segunda conmocin cerebral (como al practicar deportes de contacto).  Use un casco cuando practique actividades como: ? Andar en bicicleta. ? Esquiar. ? Andar en patineta. ? Andar en patines.  Haga de su hogar un lugar ms seguro: ? Retire las cosas del piso o  de las escaleras que podran hacerlo tropezar. ? Coloque barras en los baos y [employer]Corp en las escaleras. ? Ponga alfombras antideslizantes en pisos y baeras. ? Mejore la iluminacin en zonas de penumbra. SOLICITE AYUDA SI:  Tiene ms dificultad para  prestar atencin o concentrarse.  Le cuesta an ms recordar las cosas o aprender cosas nuevas.  Necesita ms tiempo que lo habitual para finalizar las tareas.  Est ms molesto (irritable) que antes.  Tampoco puede Charity fundraiser.  Tiene ms problemas que antes.  Tiene problemas para mantener el equilibrio.  No logra reaccionar rpidamente cuando lo necesita. Solicite ayuda si tiene alguno de estos problemas durante ms de 2 semanas:  Dolor de cabeza que perdura (crnico).  Mareos o problemas de equilibrio.  Ganas de vomitar (nuseas).  Problemas para ver (de vista).  Sentirse afectado por ruidos o la luz ms que lo normal.  Sentir tristeza, decaimiento, sufrimiento espiritual, melancola, pesimismo o vaco (depresin).  Cambios de humor (cambios en el estado de nimo).  Sensacin de miedo o nerviosismo por lo que podra ocurrir (ansiedad).  Se siente abrumado.  Problemas de memoria.  Dificultad para concentrarse o Engineer, technical sales.  Problemas para dormir.  Cansancio permanente. SOLICITE AYUDA DE INMEDIATO SI:  Tiene dolores de cabeza intensos o estos empeoran.  Siente debilidad (aun si es solo en [place], una pierna o parte del rostro).  Tiene falta de sensibilidad (adormecimiento).  Siente que pierde el equilibrio.  No deja de vomitar.  Siente cansancio.  Uno de los centros negros del ojo (pupila) es ms grande que el otro.  Se retuerce o se sacude violentamente (tiene convulsiones).  El habla no es clara (arrastra las palabras).  Se siente ms confundido, se enoja con ms facilidad (agitacin) o est ms irritable que antes.  Tiene ms dificultad para descansar que antes.  No puede Nutritional therapist o lugares.  Siente dolor en el cuello.  Le resulta difcil despertarse.  Tiene cambios de conducta inusuales.  Se desmaya (pierde el conocimiento).  ASEGRESE DE QUE:  Comprende estas instrucciones.  Controlar su  afeccin.  Recibir ayuda de inmediato si no mejora o si empeora.  Esta informacin no tiene Theme park manager el consejo del mdico. Asegrese de hacerle al mdico cualquier pregunta que tenga. Document Released: 06/03/2010 Document Revised: 05/22/2014 Document Reviewed: 11/21/2012 Elsevier Interactive Patient Education  2017 Elsevier Inc.   Vejiga hiperactiva en adultos (Overactive Bladder, Adult) El trastorno de vejiga hiperactiva es un grupo de sntomas urinarios. Si tiene vejiga hiperactiva, puede sentir la necesidad repentina de orinar de inmediato. Despus de sentir esta necesidad urgente, tambin puede tener prdida de orina si no puede llegar al bao con la rapidez suficiente (incontinencia urinaria). Estos sntomas pueden interferir con su trabajo diario y las [place]. Adems, los sntomas de vejiga hiperactiva pueden despertarlo durante la noche. El trastorno de vejiga hiperactiva afecta las seales nerviosas entre la vejiga y el cerebro. La vejiga puede recibir la seal de vaciarse antes de que est llena. Los msculos muy sensibles tambin pueden hacer que pierda orina demasiado pronto. CAUSAS Las causas de la vejiga hiperactiva pueden ser varias: Las causas posibles son las siguientes:  Infeccin urinaria.  Infeccin de los tejidos cercanos, como la prstata.  Agrandamiento de la prstata.  Estar embarazada de ms de un beb (embarazo mltiple).  Ciruga en el tero o la uretra.  Clculos en la vejiga, inflamacin o tumores.  Consumir cafena o alcohol en exceso.  Ciertos medicamentos, en especial  lo que se toman para ayudar al organismo a eliminar el lquido extra (diurticos) al aumentar la produccin de [place].  Debilidad de los msculos y nervios, especialmente a causa de lo siguiente: ? Lesin en la mdula espinal. ? Ictus. ? Esclerosis mltiple. ? La enfermedad de Parkinson.  Diabetes. Esto puede producir un volumen de orina elevado que llena  la vejiga tan rpido que la necesidad urgente de Geographical information systems officer se desencadena en forma muy intensa.  Estreimiento. La acumulacin de demasiada cantidad de heces puede ejercer presin en la vejiga. FACTORES DE RIESGO Puede correr un mayor riesgo de desarrollar vejiga hiperactiva si usted:  Es Psychiatrist.  Fuma.  Est atravesando la menopausia.  Tiene problemas de prstata.  Tiene una enfermedad neurolgica, como ictus, demencia, enfermedad de Parkinson o esclerosis mltiple (EM).  Ingiere alimentos o bebidas que irritan la vejiga. Entre ellos se incluyen el alcohol, los alimentos picantes y la cafena.  Tiene sobrepeso o es obeso. SIGNOS Y SNTOMAS Dynegy signos y los sntomas de vejiga hiperactiva se incluyen los siguientes:  Urgencia repentina e intensa de Geographical information systems officer.  Prdida de [place].  Orinar ocho o ms veces por da.  Despertarse dos o ms veces durante la noche para Geographical information systems officer. DIAGNSTICO El mdico puede sospechar la presencia de vejiga hiperactiva en funcin de los sntomas que [place]. El [employer] har un examen fsico y revisar su historia clnica. Pueden realizarle anlisis de [place] o de [place]. Por ejemplo, puede ser necesario realizar pruebas de la funcin de la vejiga para controlar la retencin de [place]. Es posible que tambin tenga que consultar a un mdico especialista en vas urinarias (urlogo). TRATAMIENTO El tratamiento para el trastorno de vejiga hiperactiva depende de la causa y la gravedad de su enfermedad. Ciertos tratamientos pueden Psychologist, educational del mdico o en la clnica. Tambin puede hacer cambios en su estilo de vida en su casa. Entre las opciones se incluyen las siguientes: Tratamientos Scientist, forensic. El especialista utiliza sensores para ayudarlo a Theme park manager atento a las seales del cuerpo.  Llevar un registro diario de los momentos en que necesita orinar y qu sucede despus de la necesidad urgente de Geographical information systems officer. Esto  puede ayudarlo a Passenger transport manager.  Entrenamiento de la vejiga. Esto lo ayuda a aprender a Scientist, physiological necesidad urgente de Geographical information systems officer al seguir un programa que lo obliga a Geographical information systems officer en intervalos regulares (vaciamiento cronometrado). Al principio, es posible que tenga que esperar unos minutos despus de sentir la necesidad urgente de Geographical information systems officer. Con el tiempo, debera poder Apple Computer con una hora de diferencia o ms.  Ejercicios de Kegel. Son ejercicios para fortalecer los msculos del piso plvico que sostienen la vejiga. La tonificacin de estos msculos puede ayudarlo a Chief Operating Officer las micciones, aun si hay hiperactividad en los msculos de la vejiga. Un especialista le ensear cmo hacer estos ejercicios en forma correcta. Deber practicarlos diariamente.  Prdida de peso. Si es obeso o tiene sobrepeso, perder [employer] sntomas de vejiga hiperactiva. Hable con su mdico acerca de cmo perder peso y si hay algn programa o mtodo especfico ms eficaz para usted.  Cambio en la dieta. Esto podra ayudar si el estreimiento empeora el trastorno de vejiga hiperactiva. El mdico o nutricionista puede explicarle de qu forma puede hacer cambios en su dieta para Acupuncturist estreimiento. Tambin es posible que tenga que consumir menos cantidad de alcohol y cafena, y beber otros lquidos en distintos momentos del da.  Dejar de fumar.  Usar apsitos para Environmental health practitionerabsorber las prdidas mientras espera que otros tratamientos surtan Robie Creekefecto. Tratamientos fsicos  Estimulacin elctrica. Los electrodos envan pulsos elctricos suaves para SunGardfortalecer los nervios o los msculos que ayudan a Chief Operating Officercontrolar la vejiga. En algunos casos, los electrodos se colocan fuera del cuerpo. En otros casos, pueden colocarse en el interior del cuerpo (implante). Este tratamiento puede demorar varios meses en surtir Massachusetts Mutual Lifeefecto.  Dispositivos complementarios. Las mujeres pueden necesitar un dispositivo de plstico que  calce en la vagina y sostenga la vejiga (pesario). Medicamentos Varios medicamentos pueden ayudar a Engineer, productiontratar el trastorno de vejiga hiperactiva y por lo general se utilizan junto con otros medicamentos. Algunos se inyectan en los msculos que participan en la miccin. Otros vienen en comprimidos. El mdico tambin puede indicarle lo siguiente:  Antiespasmdicos. Estos medicamentos bloquean las seales que los nervios envan a la vejiga. Esto evita que la vejiga elimine orina en el momento incorrecto.  Antidepresivos tricclicos. Estos tipos de antidepresivos tambin The Interpublic Group of Companiesrelajan los msculos de la vejiga. Ciruga  Puede implantarse un dispositivo que ayuda a Chief Operating Officercontrolar las seales nerviosas que indican cundo debe orinar.  Puede someterse a una ciruga de implante de electrodos para recibir Agricultural consultantuna estimulacin elctrica.  A veces, los casos muy graves de vejiga hiperactiva requieren de una ciruga para cambiar la forma de la vejiga. INSTRUCCIONES PARA EL CUIDADO EN EL HOGAR  Tome los medicamentos solamente como se lo haya indicado el mdico.  Use los implantes o un pesario como se lo haya indicado el mdico.  Modifique su dieta o estilo de vida como se lo haya recomendado su mdico. Estos pueden incluir los siguientes: ? Product managerBeber menos cantidad de lquido o beber en distintos momentos del Futures traderda. Si necesita orinar con frecuencia por las noches, es posible que tenga que dejar de beber lquidos apenas comienza la noche. ? Reduzca la ingesta de cafena o alcohol. Ambos pueden empeorar el trastorno de vejiga hiperactiva. La cafena se encuentra en el caf, el t y los refrescos. ? Haga ejercicios de Kegel para fortalecer los msculos. ? Pierda peso si lo necesita. ? Ingiera una dieta saludable y equilibrada para evitar el estreimiento.  Lleve un diario o libro de anotaciones para registrar la cantidad de lquidos que ingiere y cundo lo hace, y [place] Republictambin cundo siente necesidad de Geographical information systems officerorinar. Esto ayudar a su  mdico a Estate manager/land agentcontrolar la enfermedad. SOLICITE ATENCIN MDICA SI:  Los sntomas no mejoran despus de Medical illustratorrealizar el tratamiento.  El dolor y las molestias [place] Creekempeoran.  Tiene necesidad urgente de orinar con mayor frecuencia.  Tiene fiebre. SOLICITE ATENCIN MDICA DE INMEDIATO SI: No puede controlar la vejiga para nada. Esta informacin no tiene Theme park managercomo fin reemplazar el consejo del mdico. Asegrese de hacerle al mdico cualquier pregunta que tenga. Document Released: 04/17/2012 Document Revised: 05/22/2014 Document Reviewed: 09/24/2013 Elsevier Interactive Patient Education  [employer]2018 Elsevier Inc.

## 2018-03-26 NOTE — Progress Notes (Signed)
118/77

## 2018-03-27 ENCOUNTER — Other Ambulatory Visit: Payer: Self-pay | Admitting: Physician Assistant

## 2018-03-27 ENCOUNTER — Ambulatory Visit (HOSPITAL_COMMUNITY): Admission: RE | Admit: 2018-03-27 | Payer: BLUE CROSS/BLUE SHIELD | Source: Ambulatory Visit

## 2018-03-27 DIAGNOSIS — R053 Chronic cough: Secondary | ICD-10-CM

## 2018-03-27 DIAGNOSIS — R05 Cough: Secondary | ICD-10-CM

## 2018-09-19 ENCOUNTER — Other Ambulatory Visit: Payer: Self-pay

## 2018-09-19 ENCOUNTER — Ambulatory Visit (INDEPENDENT_AMBULATORY_CARE_PROVIDER_SITE_OTHER): Payer: BLUE CROSS/BLUE SHIELD | Admitting: Physician Assistant

## 2018-09-19 VITALS — BP 135/92 | HR 67 | Temp 98.2°F | Wt 175.0 lb

## 2018-09-19 DIAGNOSIS — R51 Headache: Secondary | ICD-10-CM

## 2018-09-19 DIAGNOSIS — R519 Headache, unspecified: Secondary | ICD-10-CM | POA: Insufficient documentation

## 2018-09-19 DIAGNOSIS — R053 Chronic cough: Secondary | ICD-10-CM | POA: Insufficient documentation

## 2018-09-19 DIAGNOSIS — R42 Dizziness and giddiness: Secondary | ICD-10-CM | POA: Diagnosis not present

## 2018-09-19 DIAGNOSIS — H7401 Tympanosclerosis, right ear: Secondary | ICD-10-CM | POA: Diagnosis not present

## 2018-09-19 DIAGNOSIS — Z8615 Personal history of latent tuberculosis infection: Secondary | ICD-10-CM | POA: Diagnosis not present

## 2018-09-19 DIAGNOSIS — R05 Cough: Secondary | ICD-10-CM

## 2018-09-19 DIAGNOSIS — H8190 Unspecified disorder of vestibular function, unspecified ear: Secondary | ICD-10-CM | POA: Insufficient documentation

## 2018-09-19 MED ORDER — NORTRIPTYLINE HCL 25 MG PO CAPS: 25.0000 mg | ORAL_CAPSULE | Freq: Every day | ORAL | 0 refills | Status: DC

## 2018-09-19 NOTE — Patient Instructions (Addendum)
Vrtigo Vertigo El vrtigo es la sensacin de que usted o todo lo que lo rodea se mueve cuando en realidad eso no sucede. El vrtigo puede ser peligroso si ocurre mientras est haciendo algo que podra suponer un riesgo para usted y para los dems, por ejemplo, conduciendo un automvil. Cules son las causas? Esta afeccin es causada por una alteracin en las seales que su sistema sensorial enva al cerebro. Existen diferentes factores que pueden causar esta alteracin, por ejemplo:  Infecciones, especialmente en el odo interno.  Una reaccin adversa a un medicamento o el uso indebido de alcohol y medicamentos.  Abstinencia de drogas o alcohol.  Cambios rpidos de posicin, como al [employer] o darse vuelta en la cama.  Cefaleas migraosas.  Una disminucin del flujo sanguneo hacia el cerebro.  Una disminucin de la presin arterial.  Un aumento de la presin en el cerebro por un traumatismo en la cabeza o el cuello, accidente cerebrovascular, infeccin, tumor o sangrado.  Trastornos del sistema nervioso central. Cules son los signos o los sntomas? Generalmente, los sntomas de este trastorno se presentan al mover la cabeza o los ojos en diferentes direcciones. Los sntomas pueden aparecer repentinamente y suelen durar menos de un minuto. Entre los sntomas se pueden incluir los siguientes:  Prdida del equilibrio y cadas.  Sensacin de estar dando vueltas o movindose.  Sensacin de que el entorno est dando vueltas o movindose.  Nuseas y vmitos.  Visin doble o borrosa.  Dificultad para escuchar.  Hablar arrastrando las palabras.  Mareos.  Movimientos oculares involuntarios (nistagmo). Los sntomas pueden ser leves y un poco molestos, o pueden ser graves e interferir con la vida cotidiana. Los episodios de vrtigo pueden repetirse (ser recurrentes) a lo largo del tiempo y algunos movimientos pueden desencadenarlos. Los sntomas pueden mejorar con Diplomatic Services operational officer. Cmo se diagnostica? Esta afeccin puede diagnosticarse en funcin de la historia clnica y la calidad del nistagmo. Su mdico podr examinar el movimiento de sus ojos indicndole que los cambie de direccin rpidamente para provocar el nistagmo. Esto se llama tambin prueba de [place], prueba de impulso ceflico o prueba de rotacin. Tal vez lo deriven a un mdico especialista en odo, nariz y garganta Designer, jewellery) o a uno que se especializa en trastornos del sistema nervioso central (neurlogo). Pueden hacerle otros estudios, entre ellos:  Un examen fsico.  Anlisis de sangre.  Resonancia magntica (RM).  Una exploracin por tomografa computarizada (TC).  Un electrocardiograma (ECG). Este estudio registra la actividad elctrica del corazn.  Un electroencefalograma (EEG). Este estudio registra la actividad elctrica del cerebro.  Pruebas de audicin. Cmo se trata? El tratamiento de esta afeccin depende de la causa y la gravedad de los sntomas. Las opciones de tratamiento incluyen:  Medicamentos para tratar las nuseas o el vrtigo. Se utilizan generalmente para los casos graves. Algunos medicamentos que se utilizan para tratar otras afecciones tambin podran reducir o eliminar los sntomas del vrtigo. Estos incluyen los siguientes: ? Medicamentos para Public house manager (antihistamnicos). ? Medicamentos para controlar las convulsiones (anticonvulsivos). ? Medicamentos para Technical sales engineer depresin (antidepresivos). ? Medicamentos para Technical sales engineer ansiedad (sedantes).  Movimientos de cabeza para acomodar el odo interno otra vez a la normalidad. Si el vrtigo es causado por un problema en el odo, su mdico podra recomendarle que haga ciertos movimientos para corregir el problema.  Ciruga. Esto es raro. Siga estas indicaciones en su casa: Seguridad  Muvase despacio.No haga movimientos bruscos con el cuerpo o con la cabeza.  No conduzca.  No  opere maquinaria pesada.  No haga ninguna tarea que podra ser peligrosa para usted o para Economistotras personas en caso de que ocurriera un episodio de vrtigo.  Si tiene dificultad para caminar o mantener el equilibrio, use un bastn para Photographermantener la estabilidad. Si se siente mareado o inestable, sintese de inmediato.  Retome sus actividades normales como se lo haya indicado el mdico. Pregntele al mdico qu actividades son seguras para usted. Instrucciones generales  Baxter Internationalome los medicamentos de venta libre y los recetados solamente como se lo haya indicado el mdico.  Evite algunas posiciones o determinados movimientos como se lo haya indicado el mdico.  Beba suficiente lquido para Pharmacologistmantener la orina clara o de color amarillo plido.  Concurra a todas las visitas de [address]seguimiento como se lo haya indicado el mdico. Esto es importante. Comunquese con un mdico si:  Los medicamentos no le [address] el vrtigo o este Henriettaempeora.  Tiene fiebre.  Su afeccin empeora o presenta sntomas nuevos.  Sus familiares o amigos advierten cambios en su comportamiento.  Las nuseas o los vmitos empeoran.  Tiene sensacin de adormecimiento o de "hormigueo" en una parte del cuerpo. Solicite ayuda de inmediato si:  Tiene dificultad para hablar o para moverse.  Esta mareado todo Allied Waste Industriesel tiempo.  Se desmaya.  Tiene dolores de cabeza intensos.  Tiene debilidad Jabil Circuiten las manos, los brazos o las piernas.  Presenta cambios en la audicin o la visin.  Siente rigidez en el cuello.  Tiene sensibilidad a Statisticianla luz. Esta informacin no tiene Theme park managercomo fin reemplazar el consejo del mdico. Asegrese de hacerle al mdico cualquier pregunta que tenga. Document Released: 02/08/2005 Document Revised: 08/08/2016 Document Reviewed: 08/24/2014 Elsevier Interactive Patient Education  2019 Elsevier Inc.   Cefalea migraosa recurrente. Recurrent Migraine Headache Una cefalea migraosa es un dolor muy intenso y punzante que  generalmente se siente en un lado de la cabeza. Las migraas recurrentes aparecen una y Laverda Pageotra vez (se repiten). Hable con su mdico United Stationerssobre los factores que pueden causar Animal nutritionist(desencadenar) las Soil scientistcefaleas migraosas. Siga estas indicaciones en su casa: Medicamentos  Baxter Internationalome los medicamentos de venta libre y los recetados solamente como se lo haya indicado el mdico.  No conduzca ni use maquinaria pesada mientras toma analgsicos recetados. Estilo de vida  No consuma ningn producto que contenga nicotina o tabaco, como cigarrillos y Administrator, Civil Servicecigarrillos electrnicos. Si necesita ayuda para dejar de fumar, consulte al mdico.  Limite el consumo de alcohol a no ms de 1medida por da si es mujer y no est Lead Hillembarazada, y 2medidas por da si es hombre. Una medida equivale a 12oz (355ml) de cerveza, 5oz (148ml) de vino o 1oz (44ml) de bebidas alcohlicas de alta graduacin.  Intente dormir de 7 a 9horas todas las noches.  Disminuya todo tipo de estrs de la vida diaria. Pregntele a su mdico cmo puede disminuir el estrs.  Mantenga un peso saludable. Hable con el mdico si necesita ayuda para bajar de [place]peso.  Realice actividad fsica con regularidad. Instrucciones generales   Lleve un registro diario para averiguar si hay ciertas cosas que le provocan la cefalea migraosa. Por ejemplo, registre lo siguiente: ? Lo que usted come y bebe. ? Cunto tiempo duerme. ? Algn cambio en su dieta o en los medicamentos.  Cuando tenga Caroleen Hammanuna migraa, acustese en un cuarto oscuro y tranquilo.  Pruebe colocando una toalla fra sobre su cabeza cuando tenga un episodio de [place]migraa.  Mantenga las luces tenues si le Goodrich Corporationmolestan las luces brillantes o la migraa Sylvan Beachempeora.  Concurra a todas las visitas de control como se lo haya indicado el mdico. Esto es importante. Comunquese con un mdico si:  Los medicamentos no Air traffic controller.  El dolor regresa.  Tiene fiebre.  Pierde peso sin proponrselo. Solicite  ayuda de inmediato si:  La migraa empeora y los medicamentos no [address].  Presenta rigidez en el cuello.  Tiene dificultad para ver.  Sus msculos estn dbiles o pierde el control muscular.  Pierde el equilibrio o tiene problemas para Advertising account planner.  Siente que va a desvanecerse (perder el conocimiento) o se desmaya.  Tiene sntomas realmente malos que son diferentes a los primeros sntomas.  Comienza a Warehouse manager dolores de [place] repentinos, muy intensos, que duran un segundo o menos, como un [place]. Resumen  Una cefalea migraosa es un dolor muy intenso y punzante que generalmente se siente en un lado de la cabeza.  Hable con su mdico United Stationers factores que pueden causar Animal nutritionist) las Soil scientist.  [employer] medicamentos de venta libre y los recetados solamente como se lo haya indicado el mdico.  Cuando tenga Caroleen Hamman, acustese en un cuarto oscuro y tranquilo.  Lleve un registro diario de lo que come y bebe, cunto tiempo duerme y si hay algn cambio en sus medicamentos. Esto puede ayudarle a averiguar si hay ciertas cosas que le provocan la cefalea migraosa. Esta informacin no tiene Theme park manager el consejo del mdico. Asegrese de hacerle al mdico cualquier pregunta que tenga. Document Released: 07/24/2011 Document Revised: 01/10/2017 Document Reviewed: 01/10/2017 Elsevier Interactive Patient Education  2019 [employer].

## 2018-09-19 NOTE — Progress Notes (Signed)
HPI:                                                                Isaac Myers is a 28 y.o. male who [name]: Primary Care Sports Medicine today for dizziness/headaches  Spanish speaking patient. Patient's significant other is serving as Equities traderinterpreter. Patient declines certified interpreter.  Patient presented in November with new onset headaches 2-3 days per week that was preceded by a closed head injury where patient purposefully struck his head against a closet door until he became unconscious. MR Brain was ordered for him 6 months ago, but he never had this completed. Headaches are unchanged in terms of character, frequency and severity.  He now endorses new onset, intermittent dizziness for approx 1 month that is sometimes associated with a right-sided occipital headache. Describes dizziness as "losing balance" and "feels like low pressure." Episodes last several minutes up to an hour and are worse with position changes and head movements. Resolve spontaneously.   Denies hearing loss/tinnitus, vision changes, nausea/vomiting, focal weakness, syncope, chest pain/plapitations.  Still complaining of a chronic cough for approximately 1 year. He was treated for latent TB, completed Rifampin 10/2017.    Past Medical History:  Diagnosis Date  . Chronic back pain   . Chronic midline thoracic back pain 05/25/2017  . Elevated ALT measurement 05/30/2017  . Positive PPD 05/29/2017   Past Surgical History:  Procedure Laterality Date  . TRIGGER POINT INJECTION     back   Social History   Tobacco Use  . Smoking status: Never Smoker  . Smokeless tobacco: Never Used  Substance Use Topics  . Alcohol use: Yes   family history includes Diabetes in his mother.    ROS: negative except as noted in the HPI  Medications: Current Outpatient Medications  Medication Sig Dispense Refill  . cetirizine (ZYRTEC) 10 MG tablet Take 1 tablet (10 mg total) by  mouth daily. (Patient not taking: Reported on 09/19/2018) 30 tablet 11  . divalproex (DEPAKOTE) 250 MG DR tablet Take 1 tablet (250 mg total) by mouth 2 (two) times daily. (Patient not taking: Reported on 09/19/2018) 60 tablet 1  . mirabegron ER (MYRBETRIQ) 25 MG TB24 tablet Take 1 tablet (25 mg total) by mouth daily. (Patient not taking: Reported on 09/19/2018) 30 tablet 3  . omeprazole (PRILOSEC) 40 MG capsule TAKE ONE CAPSULE BY MOUTH DAILY (Patient not taking: Reported on 09/19/2018) 30 capsule 0  . oxymetazoline (AFRIN NASAL SPRAY) 0.05 % nasal spray Place 1 spray into both nostrils as needed (nosebleed). (Patient not taking: Reported on 09/19/2018) 30 mL 0  . saline (AYR) GEL Place 1 application into the nose 2 (two) times daily. (Patient not taking: Reported on 09/19/2018)  0  . terbinafine (LAMISIL) 250 MG tablet Take 1 tablet (250 mg total) by mouth daily. (Patient not taking: Reported on 09/19/2018) 90 tablet 0   No current facility-administered medications for this visit.    No Known Allergies     Objective:  BP (!) 135/92   Pulse 67   Temp 98.2 F (36.8 C) (Oral)   Wt 175 lb (79.4 kg)   BMI 28.25 kg/m   Gen: well-groomed, not ill-appearing, no acute distress HEENT: head normocephalic, atraumatic; conjunctiva and cornea  clear, scarring of right TM, oropharynx clear, moist mucus membranes; neck supple, no meningeal signs Pulm: Normal work of breathing, normal phonation, clear to auscultation bilaterally CV: Normal rate, regular rhythm, s1 and s2 distinct, no murmurs, clicks or rubs Neuro:  cranial nerves II-XII intact, no nystagmus, normal finger-to-nose, normal heel-to-shin, negative pronator drift, normal rapid alternating movements, DTR's intact, normal tone, no tremor, Dix Hallpike reproduces symptoms bilaterally without nystagmus MSK: strength 5/5 and symmetric in bilateral upper and lower extremities, normal gait and station, negative Romberg  Ortho VS Laying 133/86, 65 Sitting  144/95, 70 (patient reports dizziness with laying to sitting) Standing 143/94, 75    No results found for this or any previous visit (from the past 72 hour(s)). No results found.    Assessment and Plan: 28 y.o. male with   .Isaac Myers was seen today for dizziness.  Diagnoses and all orders for this visit:  Vestibular dizziness -     Ambulatory referral to Physical Therapy  Chronic cough -     Ambulatory referral to Pulmonology  History of latent tuberculosis -     Ambulatory referral to Pulmonology  Nonintractable episodic headache, unspecified headache type -     nortriptyline (PAMELOR) 25 MG capsule; Take 1 capsule (25 mg total) by mouth at bedtime.  Tympanosclerosis, right ear   Reassuring neuro exam No orthostasis Episodic positional dizziness that is worse with head movement w/o other neurologic sx  is most consistent with BPPV. Vestibular migraine is also on the differential Starting Nortriptyline for headache prophylaxis Referral to PT for vestibular rehab Declines MRI   Patient education and anticipatory guidance given Patient agrees with treatment plan Follow-up in 1 month or sooner as needed if symptoms worsen or fail to improve  I spent 40 minutes with this patient, greater than 50% was face-to-face time counseling regarding the above diagnoses  Levonne Hubert PA-C

## 2018-09-20 ENCOUNTER — Encounter: Payer: Self-pay | Admitting: Internal Medicine

## 2018-09-30 ENCOUNTER — Encounter: Payer: Self-pay | Admitting: Physician Assistant

## 2018-10-21 ENCOUNTER — Ambulatory Visit: Payer: BC Managed Care – PPO | Admitting: Physician Assistant

## 2018-10-30 ENCOUNTER — Ambulatory Visit (INDEPENDENT_AMBULATORY_CARE_PROVIDER_SITE_OTHER): Payer: BC Managed Care – PPO | Admitting: Osteopathic Medicine

## 2018-10-30 ENCOUNTER — Encounter: Payer: Self-pay | Admitting: Osteopathic Medicine

## 2018-10-30 VITALS — BP 132/83 | HR 89 | Temp 98.5°F | Wt 178.0 lb

## 2018-10-30 DIAGNOSIS — R040 Epistaxis: Secondary | ICD-10-CM | POA: Diagnosis not present

## 2018-10-30 MED ORDER — FLUTICASONE PROPIONATE 50 MCG/ACT NA SUSP: 2.0000 | Freq: Every day | NASAL | 6 refills | Status: DC

## 2018-10-30 NOTE — Progress Notes (Signed)
HPI: Isaac Myers is a 28 y.o. male who  has a past medical history of Chronic back pain, Chronic midline thoracic back pain (05/25/2017), Elevated ALT measurement (05/30/2017), and Positive PPD (05/29/2017).  [facility] today, 10/30/18,  for chief complaint of:  Nosebleeds  Pt states he had appt tomorrow w/ Vinetta Bergamoharley for headache follow-up but wanted to be seen sooner for nosebleed issue.   Nosebleeds from L nostril x2 weeks, sometimes daily and usually in evenings/at night, nonpainful, no sinus pressure/pain, no throat pain. Headaches overall better on nortriptyline but still bothersome (hx head injury in November and subsequent headache issues since then). No new injury.     Patient is accompanied by spanish interpreter who assists with history-taking.    At today's visit 10/30/18 ... PMH, PSH, FH reviewed and updated as needed.  Current medication list and allergy/intolerance hx reviewed and updated as needed. (See remainder of HPI, ROS, Phys Exam below)   No results found.  No results found for this or any previous visit (from the past 72 hour(s)).        ASSESSMENT/PLAN: The encounter diagnosis was Epistaxis.   Reviewed what do do w/ nosebleeds and printed info given in Spanish, reviewed sleeping next to humidifer.   Nasal mucosa erythematous but nothing else noted on exam to explain bleeds. Doesn't sound like posterior bleeds.   Orders Placed This Encounter  Procedures  . Ambulatory referral to ENT     Meds ordered this encounter  Medications  . fluticasone (FLONASE) 50 MCG/ACT nasal spray    Sig: Place 2 sprays into both nostrils daily.    Dispense:  16 g    Refill:  6        Follow-up plan: Return for recheck headaches with Vinetta Bergamoharley soon  .                                                 ################################################# ################################################# ################################################# #################################################    No outpatient medications have been marked as taking for the 10/30/18 encounter (Office Visit) with [name], [name], DO.    No Known Allergies     Review of Systems:  Constitutional: No recent illness, no fever/chills  HEENT: +occasional headache but not associated w/ nosebleeds, no vision change, +nosebleeds as per HPI  Cardiac: No  chest pain, No  pressure, No palpitations  Respiratory:  No  shortness of breath. No  Cough   Exam:  BP (!) 146/91 (BP Location: Left Arm, Patient Position: Sitting, Cuff Size: Normal)   Pulse 90   Temp 98.5 F (36.9 C) (Oral)   Wt 178 lb (80.7 kg)   BMI 28.73 kg/m   Constitutional: VS see above. General Appearance: alert, well-developed, well-nourished, NAD  Eyes: Normal lids and conjunctive, non-icteric sclera  Ears, Nose, Mouth, Throat: MMM, Normal external inspection ears/nares/mouth/lips/gums. Nasal mucosa erythematous and mild edema, otherwise normal   Neck: No masses, trachea midline.   Respiratory: Normal respiratory effort. no wheeze, no rhonchi, no rales  Cardiovascular: S1/S2 normal, no murmur, no rub/gallop auscultated. RRR.   Musculoskeletal: Gait normal. Symmetric and independent movement of all extremities  Neurological: Normal balance/coordination. No tremor.  Skin: warm, dry, intact.   Psychiatric: Normal judgment/insight. Normal mood and affect. Oriented x3.       Visit summary with medication list and pertinent instructions  was printed for patient to review, patient was advised to alert [place] if any updates are needed. All questions at time of visit were answered - patient instructed to contact office with any  additional concerns. ER/RTC precautions were reviewed with the patient and understanding verbalized.     Please note: voice recognition software was used to produce this document, and typos may escape review. Please contact Dr. Sheppard Coil for any needed clarifications.    Follow up plan: Return for recheck headaches with [facility] soon .

## 2018-12-12 ENCOUNTER — Other Ambulatory Visit: Payer: Self-pay

## 2018-12-12 ENCOUNTER — Emergency Department (INDEPENDENT_AMBULATORY_CARE_PROVIDER_SITE_OTHER)
Admission: EM | Admit: 2018-12-12 | Discharge: 2018-12-12 | Disposition: A | Discharge status: Home / Self Care | Payer: BC Managed Care – PPO | Source: Home / Self Care

## 2018-12-12 DIAGNOSIS — L03031 Cellulitis of right toe: Secondary | ICD-10-CM | POA: Diagnosis not present

## 2018-12-12 DIAGNOSIS — L600 Ingrowing nail: Secondary | ICD-10-CM

## 2018-12-12 DIAGNOSIS — R030 Elevated blood-pressure reading, without diagnosis of hypertension: Secondary | ICD-10-CM

## 2018-12-12 MED ORDER — CEPHALEXIN 500 MG PO CAPS: 500.0000 mg | ORAL_CAPSULE | Freq: Three times a day (TID) | ORAL | 0 refills | Status: DC

## 2018-12-12 MED ORDER — MUPIROCIN 2 % EX OINT: TOPICAL_OINTMENT | CUTANEOUS | 0 refills | Status: DC

## 2018-12-12 NOTE — ED Triage Notes (Signed)
Pt c/o ingrown RT great toenail x 2 weeks. Removed part of it but worried about infection. Denies any current pain.

## 2018-12-12 NOTE — ED Provider Notes (Signed)
[facility]    CSN: [id number] Arrival date & time: 12/12/18  1600     History   Chief Complaint Chief Complaint  Patient presents with  . Ingrown Toenail    possibly infected    HPI Isaac Myers is a 28 y.o. male.   Spanish Interpreter [phone]  HPI Isaac Myers is a 28 y.o. male presenting to UC with c/o 2 weeks of Right great toe pain that is mildly aching and sore. He had an ingrown nail that he cut but is concerned a piece of the nail is still under his skin and has become infected. He has used Neosporin w/o relief. He has had ingrown nails in the past but is usually able to treat them at home.  He reports small amount of blood when he squeezes his toe but no drainage of pus. Denies fever or chills. No pain at this time.   BP elevated in triage. No hx of HTN. He has a PCP but they are not currently seeing patients at this time due to Covid restrictions.     Past Medical History:  Diagnosis Date  . Chronic back pain   . Chronic midline thoracic back pain 05/25/2017  . Elevated ALT measurement 05/30/2017  . Positive PPD 05/29/2017    Patient Active Problem List   Diagnosis Date Noted  . Tympanosclerosis, right ear 09/19/2018  . Nonintractable episodic headache 09/19/2018  . Vestibular dizziness 09/19/2018  . History of latent tuberculosis 09/19/2018  . Chronic cough 09/19/2018  . Acute post-traumatic headache, not intractable 03/26/2018  . OAB (overactive bladder) 03/26/2018  . Urinary frequency 03/26/2018  . Acute left eye pain 03/26/2018  . Irritability and anger 03/26/2018  . Transaminitis 01/25/2018  . Latent tuberculosis 01/25/2018  . History of treatment for tuberculosis 01/25/2018  . Recurrent epistaxis 01/25/2018  . Persistent cough for 3 weeks or longer 01/25/2018  . Dysuria 10/19/2017  . Onychomycosis of toenail 10/19/2017  . Elevated blood pressure reading 07/15/2017  . Mood disorder (HCC) 06/11/2017  . Elevated ALT  measurement 05/30/2017  . Positive PPD 05/29/2017  . Scapular dyskinesis 05/25/2017    Past Surgical History:  Procedure Laterality Date  . TRIGGER POINT INJECTION     back       Home Medications    Prior to Admission medications   Medication Sig Start Date End Date Taking? Authorizing Provider  cephALEXin (KEFLEX) 500 MG capsule Take 1 capsule (500 mg total) by mouth 3 (three) times daily. 12/12/18   Lurene ShadowPhelps, Shriley Joffe O, PA-C  fluticasone (FLONASE) 50 MCG/ACT nasal spray Place 2 sprays into both nostrils daily. 10/30/18   [name], Natalie, DO  mupirocin ointment Idelle Jo(BACTROBAN) 2 % Apply to wound 3 times daily for 5 days 12/12/18   Lurene ShadowPhelps, Burlie Cajamarca O, PA-C  nortriptyline (PAMELOR) 25 MG capsule Take 1 capsule (25 mg total) by mouth at bedtime. 09/19/18   Carlis Stableummings, Charley Elizabeth, PA-C    Family History Family History  Problem Relation Age of Onset  . Diabetes Mother   . Autoimmune disease Neg Hx     Social History Social History   Tobacco Use  . Smoking status: Never Smoker  . Smokeless tobacco: Never Used  Substance Use Topics  . Alcohol use: Yes  . Drug use: No     Allergies   Patient has no known allergies.   Review of Systems Review of Systems  Constitutional: Negative for chills and fever.  Musculoskeletal: Positive for arthralgias and joint swelling.  Right great toe  Skin: Positive for color change and wound.     Physical Exam Triage Vital Signs ED Triage Vitals [12/12/18 1628]  Enc Vitals Group     BP (!) 154/105     Pulse Rate 69     Resp 18     Temp      Temp src      SpO2 94 %     Weight      Height      Head Circumference      Peak Flow      Pain Score 0     Pain Loc      Pain Edu?      Excl. in [place]?    No data found.  Updated Vital Signs BP (!) 138/95 (BP Location: Left Arm)   Pulse 66   Resp 18   SpO2 98%   Visual Acuity Right Eye Distance:   Left Eye Distance:   Bilateral Distance:    Right Eye Near:   Left Eye Near:     Bilateral Near:     Physical Exam Vitals signs and nursing note reviewed.  Constitutional:      Appearance: Normal appearance. He is well-developed.  HENT:     Head: Normocephalic and atraumatic.  Neck:     Musculoskeletal: Normal range of motion.  Cardiovascular:     Rate and Rhythm: Normal rate.  Pulmonary:     Effort: Pulmonary effort is normal.  Musculoskeletal: Normal range of motion.        General: Tenderness present. No swelling.     Comments: Right great toe: mildly tender, full ROM.  Skin:    General: Skin is warm and dry.     Capillary Refill: Capillary refill takes less than 2 seconds.     Findings: Erythema present.     Comments: Right great toe: nail is slightly thickened, faint green tinged. Surrounding erythema, mild tenderness. No bleeding or drainage. No red streaking.   Neurological:     General: No focal deficit present.     Mental Status: He is alert and oriented to person, place, and time.  Psychiatric:        Behavior: Behavior normal.      UC Treatments / Results  Labs (all labs ordered are listed, but only abnormal results are displayed) Labs Reviewed - No data to display  EKG   Radiology No results found.  Procedures Procedures (including critical care time)  Medications Ordered in UC Medications - No data to display  Initial Impression / Assessment and Plan / UC Course  I have reviewed the triage vital signs and the nursing notes.  Pertinent labs & imaging results that were available during my care of the patient were reviewed by me and considered in my medical decision making (see chart for details).     Will tx pt for infected ingrown nail  BP elevated, encouraged to monitor and f/u with PCP   AVS provided.   Final Clinical Impressions(s) / UC Diagnoses   Final diagnoses:  Cellulitis of great toe of right foot  Ingrown right greater toenail  Elevated blood pressure reading     Discharge Instructions      Please  take antibiotics as prescribed and be sure to complete entire course even if you start to feel better to ensure infection does not come back.  Please follow up with family medicine in 1 week if not improving, you may need referral to a foot  doctor.    ED Prescriptions    Medication Sig Dispense Auth. Provider   cephALEXin (KEFLEX) 500 MG capsule Take 1 capsule (500 mg total) by mouth 3 (three) times daily. 21 capsule Waylan RocherPhelps, Eldar Robitaille O, PA-C   mupirocin ointment (BACTROBAN) 2 % Apply to wound 3 times daily for 5 days 22 g Lurene ShadowPhelps, Mikal Blasdell O, [place]PA-C     Controlled Substance Prescriptions [place] Controlled Substance Registry consulted? Not Applicable   Rolla Platehelps, Zoraya Fiorenza O, PA-C 12/12/18 1649

## 2018-12-12 NOTE — Discharge Instructions (Signed)
°  Please take antibiotics as prescribed and be sure to complete entire course even if you start to feel better to ensure infection does not come back.  Please follow up with family medicine in 1 week if not improving, you may need referral to a foot doctor.

## 2019-01-30 ENCOUNTER — Ambulatory Visit: Payer: BC Managed Care – PPO | Admitting: Family Medicine

## 2019-05-12 ENCOUNTER — Ambulatory Visit (INDEPENDENT_AMBULATORY_CARE_PROVIDER_SITE_OTHER): Payer: BC Managed Care – PPO | Admitting: Sports Medicine

## 2019-05-12 ENCOUNTER — Ambulatory Visit (INDEPENDENT_AMBULATORY_CARE_PROVIDER_SITE_OTHER): Payer: BC Managed Care – PPO

## 2019-05-12 ENCOUNTER — Encounter: Payer: Self-pay | Admitting: Sports Medicine

## 2019-05-12 ENCOUNTER — Other Ambulatory Visit: Payer: Self-pay

## 2019-05-12 DIAGNOSIS — S99911A Unspecified injury of right ankle, initial encounter: Secondary | ICD-10-CM

## 2019-05-12 DIAGNOSIS — M25571 Pain in right ankle and joints of right foot: Secondary | ICD-10-CM | POA: Diagnosis not present

## 2019-05-12 DIAGNOSIS — M7989 Other specified soft tissue disorders: Secondary | ICD-10-CM | POA: Diagnosis not present

## 2019-05-12 NOTE — Assessment & Plan Note (Signed)
Inversion injury a couple of days ago. Cam boot, no analgesics needed per his request, out of work for now, note written for at least 2 weeks. X-rays. Return to see me in 2 weeks.

## 2019-05-12 NOTE — Progress Notes (Signed)
Subjective:    CC: Right ankle injury  HPI: This visit was conducted with a video interpreter, couple of days ago elbow took a misstep, inverted his right ankle, he had immediate pain, swelling, and ability to bear weight albeit painfully so.  Since then pain has been persistent, localized laterally without radiation.  I reviewed the past medical history, family history, social history, surgical history, and allergies today and no changes were needed.  Please see the problem list section below in epic for further details.  Past Medical History: Past Medical History:  Diagnosis Date  . Chronic back pain   . Chronic midline thoracic back pain 05/25/2017  . Elevated ALT measurement 05/30/2017  . Positive PPD 05/29/2017   Past Surgical History: Past Surgical History:  Procedure Laterality Date  . TRIGGER POINT INJECTION     back   Social History: Social History   Socioeconomic History  . Marital status: Married    Spouse name: Laurdes  . Number of children: 3  . Years of education: 9th grade  . Highest education level: Not on file  Occupational History  . Not on file  Tobacco Use  . Smoking status: Never Smoker  . Smokeless tobacco: Never Used  Substance and Sexual Activity  . Alcohol use: Yes  . Drug use: No  . Sexual activity: Yes  Other Topics Concern  . Not on file  Social History Narrative  . Not on file   Social Determinants of Health   Financial Resource Strain:   . Difficulty of Paying Living Expenses: Not on file  Food Insecurity:   . Worried About Programme researcher, broadcasting/film/video in the Last Year: Not on file  . Ran Out of Food in the Last Year: Not on file  Transportation Needs:   . Lack of Transportation (Medical): Not on file  . Lack of Transportation (Non-Medical): Not on file  Physical Activity:   . Days of Exercise per Week: Not on file  . Minutes of Exercise per Session: Not on file  Stress:   . Feeling of Stress : Not on file  Social Connections:   .  Frequency of Communication with Friends and Family: Not on file  . Frequency of Social Gatherings with Friends and Family: Not on file  . Attends Religious Services: Not on file  . Active Member of Clubs or Organizations: Not on file  . Attends Banker Meetings: Not on file  . Marital Status: Not on file   Family History: Family History  Problem Relation Age of Onset  . Diabetes Mother   . Autoimmune disease Neg Hx    Allergies: No Known Allergies Medications: See med rec.  Review of Systems: No fevers, chills, night sweats, weight loss, chest pain, or shortness of breath.   Objective:    General: Well Developed, well nourished, and in no acute distress.  Neuro: Alert and oriented x3, extra-ocular muscles intact, sensation grossly intact.  HEENT: Normocephalic, atraumatic, pupils equal round reactive to light, neck supple, no masses, no lymphadenopathy, thyroid nonpalpable.  Skin: Warm and dry, no rashes. Cardiac: Regular rate and rhythm, no murmurs rubs or gallops, no lower extremity edema.  Respiratory: Clear to auscultation bilaterally. Not using accessory muscles, speaking in full sentences. Right ankle: Swollen, tender at the lateral malleolus and ATFL origin Range of motion is full in all directions. Strength is 5/5 in all directions. Stable lateral and medial ligaments; squeeze test and kleiger test unremarkable; Talar dome nontender; No pain at  base of 5th MT; No tenderness over cuboid; No tenderness over N spot or navicular prominence No tenderness on posterior aspects of lateral and medial malleolus No sign of peroneal tendon subluxations; Negative tarsal tunnel tinel's Unable to ambulate.  X-rays personally reviewed, no evidence of fracture.  Impression and Recommendations:    Right ankle injury Inversion injury a couple of days ago. Cam boot, no analgesics needed per his request, out of work for now, note written for at least 2  weeks. X-rays. Return to see me in 2 weeks.   ___________________________________________ Gwen Her. Dianah Field, M.D., ABFM., CAQSM. Primary Care and Sports Medicine [facility]  Adjunct Professor of [place] [employer]

## 2019-05-26 ENCOUNTER — Ambulatory Visit (INDEPENDENT_AMBULATORY_CARE_PROVIDER_SITE_OTHER): Payer: BC Managed Care – PPO

## 2019-05-26 ENCOUNTER — Ambulatory Visit (INDEPENDENT_AMBULATORY_CARE_PROVIDER_SITE_OTHER): Payer: BC Managed Care – PPO | Admitting: Sports Medicine

## 2019-05-26 ENCOUNTER — Other Ambulatory Visit: Payer: Self-pay

## 2019-05-26 DIAGNOSIS — Z8615 Personal history of latent tuberculosis infection: Secondary | ICD-10-CM

## 2019-05-26 DIAGNOSIS — M5030 Other cervical disc degeneration, unspecified cervical region: Secondary | ICD-10-CM | POA: Diagnosis not present

## 2019-05-26 DIAGNOSIS — R05 Cough: Secondary | ICD-10-CM | POA: Diagnosis not present

## 2019-05-26 DIAGNOSIS — S99911D Unspecified injury of right ankle, subsequent encounter: Secondary | ICD-10-CM | POA: Diagnosis not present

## 2019-05-26 MED ORDER — MELOXICAM 15 MG PO TABS: ORAL_TABLET | ORAL | 3 refills | Status: DC

## 2019-05-26 NOTE — Assessment & Plan Note (Signed)
Isaac Myers has had about 5 years of pain in his upper back, in the left periscapular region with radiation around and under the axilla. 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 He did have a cervical spine MRI back in 2019 that showed a C7-T1 disc protrusion. He did some physical therapy without much improvement. Adding meloxicam to be taken daily, return to see me in a month, we will proceed with a cervical epidural if no better.

## 2019-05-26 NOTE — Assessment & Plan Note (Signed)
Isaac Myers returns, his ankle injury has resolved. He is able to jump up and down on the affected extremity, exam is unremarkable.

## 2019-05-26 NOTE — Assessment & Plan Note (Signed)
Chest x-ray, referral to pulmonology. All further primary care issues need to be managed by one of the other primary care providers here.

## 2019-05-26 NOTE — Addendum Note (Signed)
Addended by: Monica Becton on: 05/26/2019 04:38 PM   Modules accepted: Orders

## 2019-05-26 NOTE — Progress Notes (Addendum)
    Procedures performed today:    None.  Independent interpretation of tests performed by another provider:   None.  Impression and Recommendations:    Right ankle injury Iva returns, his ankle injury has resolved. He is able to jump up and down on the affected extremity, exam is unremarkable.  DDD (degenerative disc disease), cervical Mylon has had about 5 years of pain in his upper back, in the left periscapular region with radiation around and under the axilla. 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 He did have a cervical spine MRI back in 2019 that showed a C7-T1 disc protrusion. He did some physical therapy without much improvement. Adding meloxicam to be taken daily, return to see me in a month, we will proceed with a cervical epidural if no better.  History of latent tuberculosis Chest x-ray, referral to pulmonology. All further primary care issues need to be managed by one of the other primary care providers here.    ___________________________________________ Ihor Austin. Benjamin Stain, M.D., ABFM., CAQSM. Primary Care and Sports Medicine [facility]  Adjunct Instructor of Family Medicine  [employer] Medicine

## 2019-05-30 ENCOUNTER — Encounter: Payer: Self-pay | Admitting: Internal Medicine

## 2019-06-05 DIAGNOSIS — A084 Viral intestinal infection, unspecified: Secondary | ICD-10-CM | POA: Diagnosis not present

## 2019-06-05 DIAGNOSIS — J069 Acute upper respiratory infection, unspecified: Secondary | ICD-10-CM | POA: Diagnosis not present

## 2019-06-05 DIAGNOSIS — Z1152 Encounter for screening for COVID-19: Secondary | ICD-10-CM | POA: Diagnosis not present

## 2019-06-09 ENCOUNTER — Telehealth: Payer: Self-pay

## 2019-06-09 NOTE — Telephone Encounter (Signed)
Isaac Myers called and left a message asking for information about the referral. I tried to call patient back but was unable to leave a message, voicemail is full.

## 2019-06-18 ENCOUNTER — Other Ambulatory Visit: Payer: Self-pay

## 2019-06-18 ENCOUNTER — Telehealth: Payer: Self-pay | Admitting: Pulmonary Disease

## 2019-06-18 ENCOUNTER — Ambulatory Visit (INDEPENDENT_AMBULATORY_CARE_PROVIDER_SITE_OTHER): Payer: BC Managed Care – PPO | Admitting: Pulmonary Disease

## 2019-06-18 ENCOUNTER — Encounter: Payer: Self-pay | Admitting: Pulmonary Disease

## 2019-06-18 VITALS — BP 120/76 | HR 90 | Temp 97.0°F | Ht 65.0 in | Wt 175.2 lb

## 2019-06-18 DIAGNOSIS — R059 Cough, unspecified: Secondary | ICD-10-CM

## 2019-06-18 DIAGNOSIS — R05 Cough: Secondary | ICD-10-CM

## 2019-06-18 DIAGNOSIS — Z227 Latent tuberculosis: Secondary | ICD-10-CM | POA: Diagnosis not present

## 2019-06-18 DIAGNOSIS — R053 Chronic cough: Secondary | ICD-10-CM

## 2019-06-18 DIAGNOSIS — R0602 Shortness of breath: Secondary | ICD-10-CM

## 2019-06-18 MED ORDER — ALBUTEROL SULFATE HFA 108 (90 BASE) MCG/ACT IN AERS: 2.0000 | INHALATION_SPRAY | Freq: Four times a day (QID) | RESPIRATORY_TRACT | 11 refills | Status: DC | PRN

## 2019-06-18 NOTE — Telephone Encounter (Signed)
Dr. Everardo All,   I called back and spoke with the patient's wife. She stated the health department said he will have to be tested for covid before they will allow him to have a TB test.  Can we proceed with putting in the order?

## 2019-06-18 NOTE — Telephone Encounter (Signed)
Ms. Mariah Milling called on behalf of her husband Isaac Myers.  She wants to make an appointment for him to be seen here although the Pulmonologist  referred him to the Health Department.  She does not understand why he needs to be seen by the Health Department.  Please advise.  Thanks

## 2019-06-18 NOTE — Patient Instructions (Addendum)
Appointment with [facility] Department of Public Health 06/19/19 at 10:30 am. [address] [place], [place] [address] [phone]  Take Albuterol as needed for shortness of breath and wheezing  Follow-up with NP in 3 months as long as TB test in negative

## 2019-06-18 NOTE — Telephone Encounter (Signed)
Yes go ahead and order COVID testing.  Please also clarify with patient that only the health department with test for active TB with special sputum cultures.  Mechele Collin, M.D. [facility] Pulmonary/Critical Care Medicine 06/18/2019 7:03 PM   l

## 2019-06-18 NOTE — Progress Notes (Signed)
Subjective:   PATIENT ID: Isaac Myers GENDER: male DOB: 18-Jan-1991, MRN: [id number]   HPI  Chief Complaint  Patient presents with  . Pulmonary Consult    Referred by Dr. Briant Myers for history of TB. Patient reports productive cough with dark grey sputum and runny nose. He denies fever, chills and night sweats.     Reason for Visit: New consult for latent tuberculosis  [name] is a 29 year old male with history of latent tuberculosis and cervical DDD who presents to pulmonary clinic for consultation. Spanish interpreter required for visit.  PCP note from 05/26/2019 by Dr. Benjamin Myers reviewed.  He was recently seen for ankle injury.  Due to his history of latent tuberculosis, CXR was obtained, PPD obtained and referred to pulmonary for further management.  We contacted [facility] Department. He has previously completed treatment with Rifampin x 4 months ending in July 2019. No recent TB contacts but was exposed to his niece who was being treated for active TB in Feb 2020. [place]iece [place] prior to living in [place] with whom he spent time with. For the last 8 months, he reports chronic productive cough with yellow-gray sputum. Has shortness of breath with cold/hot drinks. Associated with nasal congestion. Occasional wheezing. Denies hemoptysis. Denies fevers, chills, night sweats.  Has lived in the [place] for 13 years. Has not traveled back home to [place].  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  Past Medical History:  Diagnosis Date  . Chronic back pain   . Chronic midline thoracic back pain 05/25/2017  .  Elevated ALT measurement 05/30/2017  . Positive PPD 05/29/2017     Family History  Problem Relation Age of Onset  . Diabetes Mother   . Autoimmune disease Neg Hx      Social History   Occupational History  . Not on file  Tobacco Use  . Smoking status: Never Smoker  . Smokeless tobacco: Never Used  Substance and Sexual Activity  . Alcohol use: Yes  . Drug use: No  . Sexual activity: Yes    No Known Allergies   Outpatient Medications Prior to Visit  Medication Sig Dispense Refill  . fluticasone (FLONASE) 50 MCG/ACT nasal spray Place 2 sprays into both nostrils daily. 16 g 6  . meloxicam (MOBIC) 15 MG tablet One tab PO qAM with a meal for 2 weeks, then daily prn pain. 30 tablet 3  . nortriptyline (PAMELOR) 25 MG capsule Take 1 capsule (25 mg total) by mouth at bedtime. 90 capsule 0   No facility-administered medications prior to visit.    Review of Systems  Constitutional: Negative for chills, diaphoresis, fever, malaise/fatigue and weight loss.  HENT: Negative for congestion.   Respiratory: Positive for cough, sputum production and shortness of breath. Negative for hemoptysis and wheezing.   Cardiovascular: Negative for chest pain, palpitations and leg swelling.     Objective:   Vitals:   06/18/19 0939  BP: 120/76  Pulse: 90  Temp: (!) 97 F (36.1 C)  TempSrc: Temporal  SpO2: 98%  Weight: 175 lb 3.2 oz (79.5 kg)  Height: 5\' 5"  (1.651 m)   SpO2: 98 % O2 Device: None (Room air)  Physical Exam: General: Well-appearing, no acute distress HENT: [place], AT Eyes: EOMI, no scleral icterus Respiratory: Clear to auscultation bilaterally.  No crackles, wheezing or rales Cardiovascular: RRR, -M/R/G, no JVD Neuro: AAO x4, CNII-XII grossly intact Psych: Normal mood, normal affect  Data Reviewed:  Imaging: CXR 05/26/2019-overall normal chest x-ray with scattered calcified granulomas, unchanged compared to 2019 chest imaging  PFT: None on file     Assessment & Plan:    Discussion: 29 year old Hispanic male with hx latent TB treated in 2019 however last known exposure to individual with active Feb 2020. Since then he has had symptoms of chronic cough. Slightly increased risk of reactivation due to abnormal chest x-ray demonstrating granulomas. Rule out for active TB.  Plan: Arrange to be seen and tested by [facility] Department tomorrow with plan for AFB x 3 to rule out active TB  Check HIV status  Take Albuterol as needed for shortness of breath and wheezing  Follow-up with NP in 3 months as long as TB test in negative  Health Maintenance Immunization History  Administered Date(s) Administered  . Influenza,inj,Quad PF,6+ Mos 07/05/2017, 03/26/2018, 01/16/2019  . PPD Test 05/25/2017   CT Lung Screen - not qualified  No orders of the defined types were placed in this encounter.  Meds ordered this encounter  Medications  . albuterol (VENTOLIN HFA) 108 (90 Base) MCG/ACT inhaler    Sig: Inhale 2 puffs into the lungs every 6 (six) hours as needed for wheezing or shortness of breath.    Dispense:  18 g    Refill:  11    Return in about 3 months (around 09/15/2019).  I have spent a total time of 65-minutes on the day of the appointment reviewing prior documentation, coordinating care (contacting health department) and discussing medical diagnosis and plan with the patient/family. Imaging, labs and tests included in this note have been reviewed and interpreted independently by me.  [place]ar[place], MD [facility] 06/18/2019 10:58 AM  Office Number [phone]

## 2019-06-19 LAB — HIV ANTIBODY (ROUTINE TESTING W REFLEX): HIV 1&2 Ab, 4th Generation: NONREACTIVE

## 2019-06-19 NOTE — Telephone Encounter (Signed)
Lourdes called back. Informed her of the recs per Dr. Everardo All. Order placed for COVID test and gave information to schedule the test. Lourdes verbalized understanding and denied any further questions or concerns at this time.

## 2019-06-19 NOTE — Telephone Encounter (Signed)
lmtcb for pt's wife to relay Dr. George Hugh recs.

## 2019-06-19 NOTE — Telephone Encounter (Signed)
Looks like already scheduled with Dr. Karie Schwalbe on 2/8.

## 2019-06-20 ENCOUNTER — Ambulatory Visit: Payer: BC Managed Care – PPO | Attending: Internal Medicine

## 2019-06-20 DIAGNOSIS — Z20822 Contact with and (suspected) exposure to covid-19: Secondary | ICD-10-CM | POA: Diagnosis not present

## 2019-06-21 LAB — NOVEL CORONAVIRUS, NAA: SARS-CoV-2, NAA: NOT DETECTED

## 2019-06-23 ENCOUNTER — Ambulatory Visit: Payer: BC Managed Care – PPO | Admitting: Sports Medicine

## 2019-06-25 ENCOUNTER — Ambulatory Visit: Payer: BC Managed Care – PPO | Admitting: Sports Medicine

## 2019-06-27 DIAGNOSIS — Z111 Encounter for screening for respiratory tuberculosis: Secondary | ICD-10-CM | POA: Diagnosis not present

## 2019-07-01 ENCOUNTER — Ambulatory Visit (INDEPENDENT_AMBULATORY_CARE_PROVIDER_SITE_OTHER): Payer: BC Managed Care – PPO | Admitting: Sports Medicine

## 2019-07-01 ENCOUNTER — Encounter: Payer: Self-pay | Admitting: Sports Medicine

## 2019-07-01 ENCOUNTER — Other Ambulatory Visit: Payer: Self-pay

## 2019-07-01 DIAGNOSIS — M5030 Other cervical disc degeneration, unspecified cervical region: Secondary | ICD-10-CM

## 2019-07-01 NOTE — Progress Notes (Signed)
    Procedures performed today:    None.  Independent interpretation of tests performed by another provider:   I reviewed the report of his cervical and thoracic spine MRI, I have ordered epidurography, and assessment required an interpreter/independent historian.  Impression and Recommendations:    DDD (degenerative disc disease), cervical L returns, he has had about 5 years of pain in his upper back, predominantly left periscapular, radiation around the axilla. He does have a cervical and thoracic spine MRI from 2019 that showed a small C7-T1 disc protrusion. Physical therapy has not been effective, meloxicam not effective, we are going to proceed with a cervical epidural, left C7-T1. Return to see me 1 month after the injection to evaluate response.    ___________________________________________ Ihor Austin. Benjamin Stain, M.D., ABFM., CAQSM. Primary Care and Sports Medicine [facility]  Adjunct Instructor of Family Medicine  [employer]

## 2019-07-01 NOTE — Assessment & Plan Note (Signed)
L returns, he has had about 5 years of pain in his upper back, predominantly left periscapular, radiation around the axilla. He does have a cervical and thoracic spine MRI from 2019 that showed a small C7-T1 disc protrusion. Physical therapy has not been effective, meloxicam not effective, we are going to proceed with a cervical epidural, left C7-T1. Return to see me 1 month after the injection to evaluate response.

## 2019-07-07 ENCOUNTER — Ambulatory Visit
Admission: RE | Admit: 2019-07-07 | Discharge: 2019-07-07 | Disposition: A | Discharge status: Home / Self Care | Payer: BC Managed Care – PPO | Source: Ambulatory Visit | Attending: Sports Medicine | Admitting: Sports Medicine

## 2019-07-07 ENCOUNTER — Other Ambulatory Visit: Payer: Self-pay

## 2019-07-07 ENCOUNTER — Other Ambulatory Visit: Payer: BC Managed Care – PPO

## 2019-07-07 DIAGNOSIS — M5030 Other cervical disc degeneration, unspecified cervical region: Secondary | ICD-10-CM

## 2019-07-07 DIAGNOSIS — M549 Dorsalgia, unspecified: Secondary | ICD-10-CM | POA: Diagnosis not present

## 2019-07-07 DIAGNOSIS — M546 Pain in thoracic spine: Secondary | ICD-10-CM | POA: Diagnosis not present

## 2019-07-07 MED ORDER — TRIAMCINOLONE ACETONIDE 40 MG/ML IJ SUSP (RADIOLOGY)
60.0000 mg | Freq: Once | INTRAMUSCULAR | Status: AC
  Administered 2019-07-07: 60 mg via EPIDURAL

## 2019-07-07 MED ORDER — IOPAMIDOL (ISOVUE-M 300) INJECTION 61%
1.0000 mL | Freq: Once | INTRAMUSCULAR | Status: AC | PRN
  Administered 2019-07-07: 1 mL via EPIDURAL

## 2019-07-07 NOTE — Discharge Instructions (Signed)

## 2019-07-28 ENCOUNTER — Ambulatory Visit: Payer: BC Managed Care – PPO | Admitting: Sports Medicine

## 2019-08-19 ENCOUNTER — Encounter: Payer: Self-pay | Admitting: Medical-Surgical

## 2019-08-19 ENCOUNTER — Other Ambulatory Visit: Payer: Self-pay

## 2019-08-19 ENCOUNTER — Ambulatory Visit (INDEPENDENT_AMBULATORY_CARE_PROVIDER_SITE_OTHER): Payer: BC Managed Care – PPO | Admitting: Medical-Surgical

## 2019-08-19 VITALS — BP 137/90 | HR 81 | Temp 98.3°F | Ht 63.5 in | Wt 178.0 lb

## 2019-08-19 DIAGNOSIS — R82998 Other abnormal findings in urine: Secondary | ICD-10-CM | POA: Diagnosis not present

## 2019-08-19 DIAGNOSIS — R8290 Unspecified abnormal findings in urine: Secondary | ICD-10-CM | POA: Diagnosis not present

## 2019-08-19 DIAGNOSIS — R300 Dysuria: Secondary | ICD-10-CM | POA: Diagnosis not present

## 2019-08-19 LAB — POCT URINALYSIS DIP (CLINITEK)
Bilirubin, UA: NEGATIVE
Blood, UA: NEGATIVE
Glucose, UA: NEGATIVE mg/dL
Ketones, POC UA: NEGATIVE mg/dL
Leukocytes, UA: NEGATIVE
Nitrite, UA: NEGATIVE
POC PROTEIN,UA: NEGATIVE
Spec Grav, UA: >=1.03 — AB (ref 1.010–1.025)
Urobilinogen, UA: 0.2 E.U./dL
pH, UA: 6.5 (ref 5.0–8.0)

## 2019-08-19 NOTE — Progress Notes (Signed)
Subjective:    CC: Dysuria, malodorous urine  HPI: Pleasant 29 year old Spanish-speaking male presenting today for dysuria.  Significant other present to serve as Nurse, learning disability.  Patient declined certified interpreter.  Reports he has had some dysuria for the last 4 months which is now accompanied by lower back pain worse on the left than right.  Having urinary frequency with dark yellow urine that he reports burns some.  Was previously referred to urology but did not make that appointment.  Drinks approximately 4-5 water bottles per day while at work but does endorse 2 cups of coffee per day.  Is currently sexually active with one male partner.  They do not use condoms.  No known exposure to STIs.  Girlfriend does report that she gets lots of yeast infections and wonders if this may affect him.  No fevers, chills, nausea, vomiting, blood in urine.  I reviewed the past medical history, family history, social history, surgical history, and allergies today and no changes were needed.  Please see the problem list section below in epic for further details.  Past Medical History: Past Medical History:  Diagnosis Date  . Chronic back pain   . Chronic midline thoracic back pain 05/25/2017  . Elevated ALT measurement 05/30/2017  . Positive PPD 05/29/2017   Past Surgical History: Past Surgical History:  Procedure Laterality Date  . TRIGGER POINT INJECTION     back   Social History: Social History   Socioeconomic History  . Marital status: Married    Spouse name: Laurdes  . Number of children: 3  . Years of education: 9th grade  . Highest education level: Not on file  Occupational History  . Not on file  Tobacco Use  . Smoking status: Never Smoker  . Smokeless tobacco: Never Used  Substance and Sexual Activity  . Alcohol use: Yes  . Drug use: No  . Sexual activity: Yes  Other Topics Concern  . Not on file  Social History Narrative  . Not on file   Social Determinants of Health    Financial Resource Strain:   . Difficulty of Paying Living Expenses:   Food Insecurity:   . Worried About Programme researcher, broadcasting/film/video in the Last Year:   . Barista in the Last Year:   Transportation Needs:   . Freight forwarder (Medical):   Marland Kitchen Lack of Transportation (Non-Medical):   Physical Activity:   . Days of Exercise per Week:   . Minutes of Exercise per Session:   Stress:   . Feeling of Stress :   Social Connections:   . Frequency of Communication with Friends and Family:   . Frequency of Social Gatherings with Friends and Family:   . Attends Religious Services:   . Active Member of Clubs or Organizations:   . Attends Banker Meetings:   Marland Kitchen Marital Status:    Family History: Family History  Problem Relation Age of Onset  . Diabetes Mother   . Autoimmune disease Neg Hx    Allergies: No Known Allergies Medications: See med rec.  Review of Systems: No fevers, chills, night sweats, weight loss, chest pain, or shortness of breath.   Objective:    General: Well Developed, well nourished, and in no acute distress.  Neuro: Alert and oriented x3.  HEENT: Normocephalic, atraumatic.  Skin: Warm and dry. Cardiac: Regular rate and rhythm, no murmurs rubs or gallops, no lower extremity edema.  Respiratory: Clear to auscultation bilaterally. Not using accessory muscles,  speaking in full sentences. Abdomen: Abdomen soft, mildly tender in bilateral lower quadrants, nondistended.  Bowel sounds positive x4 quadrants.  No HSM.  No CVA tenderness.   Impression and Recommendations:    Dysuria/malodorous urine POCT UA negative, elevated spec gravity.  Sending for culture and GC/chlamydia testing.  No blood on UA, low suspicion for kidney stones.  Discussed importance of increasing fluid intake, limiting caffeine, and follow-up with urology.  New referral to urology placed.    Return if symptoms worsen or fail to  improve.  ___________________________________________ Clearnce Sorrel, DNP, APRN, FNP-BC Primary Care and [place]

## 2019-08-20 ENCOUNTER — Ambulatory Visit: Payer: BC Managed Care – PPO | Attending: Internal Medicine

## 2019-08-20 ENCOUNTER — Telehealth: Payer: Self-pay | Admitting: Pulmonary Disease

## 2019-08-20 DIAGNOSIS — Z20822 Contact with and (suspected) exposure to covid-19: Secondary | ICD-10-CM

## 2019-08-20 LAB — COMPLETE METABOLIC PANEL WITH GFR
AG Ratio: 1.7 (calc) (ref 1.0–2.5)
ALT: 93 U/L — ABNORMAL HIGH (ref 9–46)
AST: 37 U/L (ref 10–40)
Albumin: 4.8 g/dL (ref 3.6–5.1)
Alkaline phosphatase (APISO): 105 U/L (ref 36–130)
BUN: 17 mg/dL (ref 7–25)
CO2: 27 mmol/L (ref 20–32)
Calcium: 9.8 mg/dL (ref 8.6–10.3)
Chloride: 101 mmol/L (ref 98–110)
Creat: 0.81 mg/dL (ref 0.60–1.35)
GFR, Est African American: 141 mL/min/{1.73_m2} (ref >=60)
GFR, Est Non African American: 122 mL/min/{1.73_m2} (ref >=60)
Globulin: 2.9 g/dL (calc) (ref 1.9–3.7)
Glucose, Bld: 93 mg/dL (ref 65–99)
Potassium: 4.2 mmol/L (ref 3.5–5.3)
Sodium: 138 mmol/L (ref 135–146)
Total Bilirubin: 0.4 mg/dL (ref 0.2–1.2)
Total Protein: 7.7 g/dL (ref 6.1–8.1)

## 2019-08-20 NOTE — Telephone Encounter (Signed)
Noted. Will route message to Dr. Everardo All to make her aware.

## 2019-08-21 LAB — NOVEL CORONAVIRUS, NAA: SARS-CoV-2, NAA: NOT DETECTED

## 2019-08-21 LAB — C. TRACHOMATIS/N. GONORRHOEAE RNA
C. trachomatis RNA, TMA: NOT DETECTED
N. gonorrhoeae RNA, TMA: NOT DETECTED

## 2019-08-21 LAB — URINE CULTURE
MICRO NUMBER:: 10335135
Result:: NO GROWTH
SPECIMEN QUALITY:: ADEQUATE

## 2019-08-21 LAB — SARS-COV-2, NAA 2 DAY TAT

## 2019-09-15 ENCOUNTER — Encounter: Payer: Self-pay | Admitting: Pulmonary Disease

## 2019-09-15 ENCOUNTER — Other Ambulatory Visit: Payer: Self-pay

## 2019-09-15 ENCOUNTER — Ambulatory Visit (INDEPENDENT_AMBULATORY_CARE_PROVIDER_SITE_OTHER): Payer: BC Managed Care – PPO | Admitting: Pulmonary Disease

## 2019-09-15 ENCOUNTER — Other Ambulatory Visit (INDEPENDENT_AMBULATORY_CARE_PROVIDER_SITE_OTHER): Payer: BC Managed Care – PPO

## 2019-09-15 VITALS — BP 128/86 | HR 91 | Temp 98.4°F | Ht 63.5 in | Wt 178.9 lb

## 2019-09-15 DIAGNOSIS — R053 Chronic cough: Secondary | ICD-10-CM

## 2019-09-15 DIAGNOSIS — R05 Cough: Secondary | ICD-10-CM

## 2019-09-15 DIAGNOSIS — Z8615 Personal history of latent tuberculosis infection: Secondary | ICD-10-CM

## 2019-09-15 DIAGNOSIS — R0602 Shortness of breath: Secondary | ICD-10-CM | POA: Diagnosis not present

## 2019-09-15 LAB — CBC WITH DIFFERENTIAL/PLATELET
Basophils Absolute: 0 10*3/uL (ref 0.0–0.1)
Basophils Relative: 0.4 % (ref 0.0–3.0)
Eosinophils Absolute: 0.2 10*3/uL (ref 0.0–0.7)
Eosinophils Relative: 1.9 % (ref 0.0–5.0)
HCT: 45 % (ref 39.0–52.0)
Hemoglobin: 15.5 g/dL (ref 13.0–17.0)
Lymphocytes Relative: 26.8 % (ref 12.0–46.0)
Lymphs Abs: 2.8 10*3/uL (ref 0.7–4.0)
MCHC: 34.4 g/dL (ref 30.0–36.0)
MCV: 88 fl (ref 78.0–100.0)
Monocytes Absolute: 0.8 10*3/uL (ref 0.1–1.0)
Monocytes Relative: 7.3 % (ref 3.0–12.0)
Neutro Abs: 6.5 10*3/uL (ref 1.4–7.7)
Neutrophils Relative %: 63.6 % (ref 43.0–77.0)
Platelets: 208 10*3/uL (ref 150.0–400.0)
RBC: 5.11 Mil/uL (ref 4.22–5.81)
RDW: 13.4 % (ref 11.5–15.5)
WBC: 10.3 10*3/uL (ref 4.0–10.5)

## 2019-09-15 MED ORDER — OMEPRAZOLE 20 MG PO CPDR: 20.0000 mg | DELAYED_RELEASE_CAPSULE | Freq: Every day | ORAL | 4 refills | Status: DC

## 2019-09-15 NOTE — Assessment & Plan Note (Signed)
Recently evaluated at [facility] department and found to be negative for active TB as of April/2021 per chart review  Plan: Continue clinically monitor Pulmonary function tests ordered

## 2019-09-15 NOTE — Assessment & Plan Note (Signed)
Ongoing chronic cough History of latent TB Recent concern for active TB ruled negative by evaluation at [facility] department Rhinorrhea and postnasal drip on exam Active acid reflux  Plan: Pulmonary function testing ordered Start daily antihistamine Start omeprazole Reviewed acid reflux diet medic [place] treatment Provided educational information on AVS in Spanish for patient regarding allergic rhinitis as well as acid reflux as well as a diet for acid reflux

## 2019-09-15 NOTE — Patient Instructions (Addendum)
You were seen today by Coral Ceo, NP  for:   1. Chronic cough  - Pulmonary function test; Future - omeprazole (PRILOSEC) 20 MG capsule; Take 1 capsule (20 mg total) by mouth daily.  Dispense: 30 capsule; Refill: 4 - CBC with Differential/Platelet; Future - IgE; Future  GERD management: >>>Avoid laying flat until 2 hours after meals >>>Elevate head of the bed including entire chest >>>Reduce size of meals and amount of fat, acid, spices, caffeine and sweets >>>If you are smoking, Please stop! >>>Decrease alcohol consumption >>>Work on maintaining a healthy weight with normal BMI   Omeprazole 20 mg tablet  >>>Please take 1 tablet daily 15 minutes to 30 minutes before your first meal of the day as well as before your other medications >>>Try to take at the same time each day >>>take this medication daily  Please start taking a daily antihistamine:  >>>choose one of: zyrtec, claritin, allegra, or xyzal  >>>these are over the counter medications  >>>can choose generic option  >>>take daily  >>>this medication helps with allergies, post nasal drip, and cough    Labwork today - [address]  2. Latent tuberculosis  History of, recently tested negative at [facility] department  3. Shortness of breath  Pulmonary function test ordered today   We recommend today:  Orders Placed This Encounter  Procedures  . CBC with Differential/Platelet    Standing Status:   Future    Standing Expiration Date:   09/14/2020  . IgE    Standing Status:   Future    Standing Expiration Date:   09/14/2020  . Pulmonary function test    Standing Status:   Future    Standing Expiration Date:   09/14/2020    Order Specific Question:   Where should this test be performed?    Answer:   [facility]    Order Specific Question:   Full PFT: includes the following: basic spirometry, spirometry pre & post bronchodilator, diffusion capacity (DLCO), lung volumes    Answer:   Full PFT    Orders Placed This Encounter  Procedures  . CBC with Differential/Platelet  . IgE  . Pulmonary function test   Meds ordered this encounter  Medications  . omeprazole (PRILOSEC) 20 MG capsule    Sig: Take 1 capsule (20 mg total) by mouth daily.    Dispense:  30 capsule    Refill:  4    Follow Up:    Return in about 6 weeks (around 10/27/2019), or if symptoms worsen or fail to improve, for Follow up for PFT, Follow up with Elisha Headland FNP-C.   Please do your part to reduce the spread of COVID-19:      Reduce your risk of any infection  and COVID19 by using the similar precautions used for avoiding the common cold or flu:  Marland Kitchen Wash your hands often with soap and warm water for at least 20 seconds.  If soap and water are not readily available, use an alcohol-based hand sanitizer with at least 60% alcohol.  . If coughing or sneezing, cover your mouth and nose by coughing or sneezing into the elbow areas of your shirt or coat, into a tissue or into your sleeve (not your hands). Drinda Butts A MASK when in public  . Avoid shaking hands with others and consider head nods or verbal greetings only. . Avoid touching your eyes, nose, or mouth with unwashed hands.  . Avoid close contact with people who  are sick. . Avoid places or events with large numbers of people in one location, like concerts or sporting events. . If you have some symptoms but not all symptoms, continue to monitor at home and seek medical attention if your symptoms worsen. . If you are having a medical emergency, call 911.   ADDITIONAL HEALTHCARE OPTIONS FOR PATIENTS  [facility] / e-Visit: [email or web address]         [facility]: [phone]  [facility]: [phone]                   [facility]: [phone]     It is flu season:   >>> Best ways to protect herself from the flu: Receive the yearly flu vaccine, practice  good hand hygiene washing with soap and also using hand sanitizer when available, eat a nutritious meals, get adequate rest, hydrate appropriately   Please contact the office if your symptoms worsen or you have concerns that you are not improving.   Thank you for choosing [facility] for your healthcare, and for allowing us to partner with you on your healthcare journey. I am thankful to be able to provide care to you today.   Elisha HeadlandBrian Havier Deeb FNP-C    Enfermedad de reflujo gastroesofgico en los adultos Gastroesophageal Reflux Disease, Adult El reflujo gastroesofgico (RGE) ocurre cuando el cido del estmago sube por el tubo que conecta la boca con el estmago (esfago). Normalmente, la comida baja por el esfago y se mantiene en el [address], donde se la digiere. Cuando una persona tiene RGE, los alimentos y el cido estomacal suelen volver al esfago. Usted puede tener una enfermedad llamada enfermedad de reflujo gastroesofgico (ERGE) si el reflujo:  Sucede a menudo.  Causa sntomas frecuentes o muy intensos.  Causa problemas tales como dao en el esfago. Cuando esto ocurre, el esfago duele y se hincha (inflama). Con el tiempo, la ERGE puede ocasionar pequeos agujeros (lceras) en el revestimiento del esfago. Cules son las causas? Esta afeccin se debe a un problema en el msculo que se encuentra entre el esfago y [place] estmago. Cuando este msculo est dbil o no es normal, no se cierra correctamente para impedir que los alimentos y el cido regresen del Teaching laboratory technicianestmago. El msculo puede debilitarse debido a lo siguiente:  El consumo de Shelltowntabaco.  WingdaleEmbarazo.  Tener cierto tipo de hernia (hernia de hiato).  Consumo de alcohol.  Ciertos alimentos y bebidas, como caf, chocolate, cebollas y Onstedmenta. Qu incrementa el riesgo? Es ms probable que tenga esta afeccin si:  Tiene sobrepeso.  Tiene una enfermedad que afecta el tejido conjuntivo.  [place] antiinflamatorios no  esteroideos (AINE). Cules son los signos o los sntomas? Los sntomas de esta afeccin incluyen:  Acidez estomacal.  Dificultad o dolor al tragar.  Sensacin de Warehouse managertener un bulto en la garganta.  Sabor amargo en la boca.  Mal aliento.  Tener una gran cantidad de saliva.  Estmago inflamado o con Dentistmalestar.  Eructos.  Dolor en el pecho. El dolor de pecho puede deberse a distintas afecciones. Asegrese de Science writerconsultar a su mdico si tiene Journalist, newspaperdolor en el pecho.  Falta de aire o respiracin ruidosa (sibilancias).  Tos constante (crnica) o durante la noche.  Desgaste de la superficie de los dientes (esmalte dental).  Prdida de peso. Cmo se trata? El tratamiento depender de la gravedad de los sntomas. El mdico puede sugerirle lo siguiente:  Cambios en la dieta.  Medicamentos.  Cipriano MileUna ciruga.  Siga estas indicaciones en su casa: Comida y bebida   Siga una dieta como se lo haya indicado el mdico. Es posible que deba evitar alimentos y bebidas, por ejemplo: ? Caf y t (con o sin cafena). ? Bebidas que contengan alcohol. ? Bebidas energticas y deportivas. ? Bebidas gaseosas y refrescos. ? Chocolate y cacao. ? Menta y esencia de [place]. ? Ajo y cebolla. ? Rbano picante. ? Alimentos cidos y condimentados. Estos incluyen todos los tipos de pimientos, [place] en polvo, curry en polvo, vinagre, salsas picantes y Occidental Petroleum. ? Ctricos y sus jugos, por ejemplo, naranjas, limones y limas. ? Alimentos que CSX Corporation. Estos incluyen salsa roja, [place], salsa picante y pizza con salsa de [place]. ? Alimentos fritos y Lexicographer. Estos incluyen donas, papas fritas, papitas fritas de bolsa y aderezos con alto contenido de [place] 60 deg [place] ? Carnes con alto contenido de [place] 60 [place]). Estas incluye los perros calientes, chuletas o costillas, embutidos, jamn y tocino. ? Productos lcteos ricos en grasas, como leche [place], [place] y [place] crema.  Consuma pequeas cantidades de comida con ms  frecuencia. Evite consumir porciones abundantes.  Evite beber grandes cantidades de lquidos con las comidas.  Evite comer 2 o 3horas antes de acostarse.  Evite recostarse inmediatamente despus de comer.  No haga ejercicios enseguida despus de comer. Estilo de vida   No consuma ningn producto que contenga nicotina o tabaco. Estos incluyen cigarrillos, cigarrillos electrnicos y tabaco para Theatre manager. Si necesita ayuda para dejar de fumar, consulte al American Express.  Intente reducir J. [employer]ney de estrs. Si necesita ayuda para hacer esto, consulte al mdico.  Si tiene sobrepeso, baje una cantidad de peso saludable para usted. Consulte a su mdico para bajar de peso de MetLife. Indicaciones generales  Est atento a cualquier cambio en los sntomas.  Tome los medicamentos de venta libre y los recetados solamente como se lo haya indicado el mdico. No tome aspirina, ibuprofeno ni otros AINE a menos que el mdico lo autorice.  Use ropa holgada. No use nada apretado alrededor de la cintura.  Levante (eleve) la cabecera de la cama aproximadamente 6pulgadas (15cm).  Evite inclinarse si al hacerlo empeoran los sntomas.  Concurra a todas las visitas de [address] se lo haya indicado el mdico. Esto es importante. Comunquese con un mdico si:  Aparecen nuevos sntomas.  Adelgaza y no sabe por qu.  Tiene problemas para tragar o le duele cuando traga.  Tiene sibilancias o tos persistente.  Los sntomas no mejoran con Scientist, research (medical).  Tiene la voz ronca. Solicite ayuda inmediatamente si:  Goldman Sachs, el cuello, la [place], los dientes o la espalda.  Se siente transpirado, mareado o tiene una sensacin de desvanecimiento.  Siente falta de aire o Journalist, newspaper.  Vomita y el vmito tiene un aspecto similar a la sangre o a los posos de caf.  Pierde el conocimiento (se desmaya).  Las deposiciones (heces) son sanguinolentas o negras.  No puede  tragar, beber o comer. Resumen  Si una persona tiene enfermedad de reflujo gastroesofgico (ERGE), los alimentos y el cido estomacal suben al esfago y causan sntomas o problemas tales como dao en el esfago.  El tratamiento depender de la gravedad de los sntomas.  Siga una Air traffic controller se lo haya indicado el mdico.  Tome todos los medicamentos solamente como se lo haya indicado el mdico. Esta informacin no tiene Theme park manager el consejo del mdico. Asegrese de hacerle al mdico cualquier pregunta que tenga.  Document Revised: 12/13/2017 Document Reviewed: 12/13/2017 Elsevier Patient Education  [place] de alimentos para pacientes adultos con enfermedad de reflujo gastroesofgico Food Choices for Gastroesophageal Reflux Disease, Adult Si tiene enfermedad de reflujo gastroesofgico (ERGE), los alimentos que consume y los hbitos de alimentacin son Theatre stage manager. Elegir los alimentos adecuados puede ayudar a [employer] Department Stores. Piense en consultar a un especialista en nutricin (nutricionista) para que lo ayude a Warehouse manager. Consejos para seguir [place] [employer] plan  Comidas  Elija alimentos saludables con bajo contenido de grasa, como frutas, verduras, cereales integrales, productos lcteos descremados y carne [place] de [place], de pescado y de [place].  Haga comidas pequeas durante Psychiatrist de 3 comidas abundantes. Coma lentamente y en un lugar donde est distendido. Evite agacharse o recostarse hasta 2 o 3horas despus de haber comido.  Evite comer 2 a 3horas antes de ir a acostarse.  Evite beber grandes cantidades de lquidos con las comidas.  Evite frer los alimentos a la hora de la coccin. Puede hornear, grillar o asar a la parrilla.  Evite o limite la cantidad de: ? Chocolate. ? Menta y mentol. ? Alcohol. ? Pimienta. ? Caf negro y descafeinado. ? T negro y descafeinado. ? Bebidas con gas (gaseosas). ? Bebidas energizantes  y refrescos que contengan cafena.  Limite los alimentos con alto contenido de [place], por ejemplo: ? Carnes grasas o alimentos fritos. ? Leche entera, crema, manteca o helado. ? Nueces y [place] de frutos secos. ? Pastelera, donas y dulces hechos con [place] o [place].  Evite los alimentos que le ocasionen sntomas. Estos pueden ser distintos para Higher education careers adviser. Los alimentos que suelen causan sntomas son los siguientes: ? Risk analyst. ? Naranjas, limones y limas. ? Pimientos. ? Comidas condimentadas. ? Cebolla y [place]. ? Vinagre. Estilo de vida  Mantenga un peso saludable. Pregntele a su mdico cul es el peso saludable para usted. Si necesita perder peso, hable con su mdico para hacerlo de manera segura.  Realice actividad fsica durante, al menos, 30 minutos 5 das por semana o ms, o segn lo indicado por su mdico.  Use ropa suelta.  No fume. Si necesita ayuda para dejar de fumar, consulte al mdico.  Duerma con la cabecera de la cama ms elevada que los pies. Use una cua debajo del colchn o bloques debajo del armazn de la cama para Theatre manager la cabecera de la cama elevada. Resumen  Si tiene enfermedad de reflujo gastroesofgico (ERGE), las elecciones de alimentos y el [place] de vida son muy importantes para ayudar a Public house manager los sntomas.  Haga comidas pequeas durante Psychiatrist de 3 comidas abundantes. Coma lentamente y en un lugar donde est distendido.  Limite los alimentos con alto contenido graso como la carne grasa o los alimentos fritos.  Evite agacharse o recostarse hasta 2 o 3horas despus de haber comido.  Evite la menta y Adel buena, la cafena, el alcohol y el chocolate. Esta informacin no tiene Marine scientist el consejo del mdico. Asegrese de hacerle al mdico cualquier pregunta que tenga. Document Revised: 12/05/2016 Document Reviewed: 12/05/2016 Elsevier Patient Education  2020 [place] en adultos Allergic  Rhinitis, Adult La rinitis alrgica es una reaccin alrgica que afecta la membrana mucosa que se encuentra en la nariz. Provoca estornudos, goteo o congestin nasal, y la sensacin de que baja mucosidad por la parte trasera de la garganta(goteo posnasal). La rinitis alrgica puede ser de leve a grave.  Guardian Life Insurance tipos de rinitis alrgica:  Astronomer. Este tipo tambin se denomina fiebre del heno. Sucede nicamente durante algunas estaciones.  Perenne. Este tipo puede ocurrir en cualquier momento del ao. Cules son las causas? Esta afeccin ocurre cuando el sistema de defensa del cuerpo(sistema inmunitario) reacciona a ciertas sustancias inofensivas llamadas alrgenos como si fueran grmenes.  La rinitis alrgica estacional se desencadena por el polen, que puede provenir del csped, los rboles y [place]. La rinitis alrgica perenne puede ser causada por:  Los caros del polvo en Advice worker.  La caspa de las [place].  Esporas del moho. Cules son los signos o los sntomas? Los sntomas de esta afeccin incluyen lo siguiente:  Estornudos.  Nariz tapada o que gotea (congestin nasal).  Goteo posnasal.  Escozor en la Darene Lamer.  Ojos llorosos.  Dificultad para dormir.  Somnolencia Administrator. Cmo se diagnostica? Esta afeccin se puede diagnosticar en funcin de lo siguiente:  Sus antecedentes mdicos.  Un examen fsico.  Estudios para detectar afecciones relacionadas, como las siguientes: ? Asma. ? Ojo rojo. ? Infeccin en los odos. ? Infeccin de las vas respiratorias superiores.  Estudios para Location manager sus sntomas. Por ejemplo, anlisis de sangre y cutneos. Cmo se trata? No hay cura para esta afeccin, pero el tratamiento puede ayudar a AGCO Corporation sntomas. El tratamiento puede incluir lo siguiente:  Tomar medicamentos que CSX Corporation sntomas de la [place], [place] antihistamnicos. Medicamentos que pueden administrarse  por inyeccin, aerosol nasal o pldoras.  Evitar el alrgeno.  Desensibilizacin. Este tratamiento implica que se le apliquen inyecciones continuas hasta que su cuerpo se vuelva menos sensible al alrgeno. Este tratamiento se puede realizar si otros tratamientos no son eficaces.  Si tomar medicamentos y Multimedia programmer alrgeno no funciona, se pueden recetar nuevos medicamentos ms fuertes. Siga estas indicaciones en su casa:  Conozca a qu es Best boy. Los alrgenos comunes incluyen el humo, polvo y [place].  Evite las cosas a las cuales es alrgico. Hay medidas que puede tomar para ayudar a Automotive engineer los alrgenos: ? Center[employer] Energy alfombras por pisos de [place], baldosas o vinilo. Las alfombras pueden retener la caspa de los animales y [place]. ? No fume. No permita que fumen en su casa. ? Cambie el filtro de la calefaccin y del aire acondicionado al menos una vez al mes. ? Durante la temporada de alergias:  Mantenga las ventanas cerradas la mayor cantidad de [place] posible.  Planee actividades al aire libre cuando las concentraciones de polen estn en su nivel ms bajo. Normalmente, esto es FirstEnergy Corp de noche.  Cuando ingrese al interior, [place] de ropa y dese una ducha antes de sentarse en los muebles o la cama.  Tome los medicamentos de venta libre y los recetados solamente como se lo haya indicado el mdico.  Oceanographer a todas las visitas de seguimiento como se lo haya indicado el mdico. Esto es importante. Comunquese con un mdico si:  Tiene fiebre.  Tiene tos persistente.  Comienza a emitir un sonido agudo al respirar (sibilancia).  Sus sntomas interfieren con sus actividades diarias normales. Solicite ayuda de inmediato si:  Le falta el aire. Resumen  Esta afeccin puede controlarse al tomar [employer] Par[employer] como se le indique y al Secretary/administrator.  Comunquese con su mdico si tiene tos persistente o fiebre.  Durante la temporada de [place], [place] las  ventanas cerradas la mayor cantidad de [place] posible. Esta informacin no tiene Theme park manager el consejo del  mdico. Asegrese de hacerle al mdico cualquier pregunta que tenga. Document Revised: 08/16/2016 Document Reviewed: 08/16/2016 Elsevier Patient Education  2020 [employer].

## 2019-09-15 NOTE — Progress Notes (Signed)
@Patient  ID: , male    DOB: [birth year], 29 y.o.   MRN: [id number]  Chief Complaint  Patient presents with  . Follow-up    3 month f/u. States he is still coughing up thick, clear phlegm. SOB tends to increase with the coughing spells.     Referring provider: [id number]*  HPI:  29 year old male never smoker referred to our office in February/2021 for history of latent TB, chronic cough as well as recent TB exposure  PMH: Mood disorder, overactive bladder, degenerative disc disease Smoker/ Smoking History: Never Smoker Maintenance: None Pt of: Dr. March/2021  09/15/2019  - Visit   29 year old male never smoker presenting back to office today.  Patient reports that he continues to have a ongoing chronic cough with clear sputum.  He denies any history of childhood asthma.  He is a never smoker.  He reports that he typically has worsened breathing during the winter months.  He has never been on any inhalers before.  He also has ongoing reflux.  He is not on any controller therapies for this.  Patient also reporting clear nasal drainage.  Not taking any daily allergy pills.    Recently seen by primary care and was referred to urology per chart review.  Also per chart review patient has been evaluated by [facility] department and found to be negative for active TB.  Patient presenting today with Spanish interpreter  Tests:   08/20/2019-SARS-CoV-2-not detected  Telephone message from April/2021 from the [facility] department stating that TB testing was negative, sputum was negative, chest x-ray was clear 05/26/2019-chest x-ray-no active cardiopulmonary disease, again calcified granulomas bilaterally  FENO:  No results found for: NITRICOXIDE  PFT: No flowsheet data found.  WALK:  No flowsheet data found.  Imaging: No results found.  Lab Results:  CBC    Component Value Date/Time   WBC 8.5 01/25/2018 1529   RBC 5.28 01/25/2018 1529    HGB 15.6 01/25/2018 1529   HCT 45.1 01/25/2018 1529   PLT 248 01/25/2018 1529   MCV 85.4 01/25/2018 1529   MCH 29.5 01/25/2018 1529   MCHC 34.6 01/25/2018 1529   RDW 12.4 01/25/2018 1529   LYMPHSABS 3,222 01/25/2018 1529   EOSABS 221 01/25/2018 1529   BASOSABS 9 01/25/2018 1529    BMET    Component Value Date/Time   NA 138 08/19/2019 1406   K 4.2 08/19/2019 1406   CL 101 08/19/2019 1406   CO2 27 08/19/2019 1406   GLUCOSE 93 08/19/2019 1406   BUN 17 08/19/2019 1406   CREATININE 0.81 08/19/2019 1406   CALCIUM 9.8 08/19/2019 1406   GFRNONAA 122 08/19/2019 1406   GFRAA 141 08/19/2019 1406    BNP No results found for: BNP  ProBNP No results found for: PROBNP  Specialty Problems      Pulmonary Problems   Recurrent epistaxis   Chronic cough   Shortness of breath      No Known Allergies  Immunization History  Administered Date(s) Administered  . Influenza,inj,Quad PF,6+ Mos 07/05/2017, 03/26/2018, 01/16/2019  . PPD Test 05/25/2017    Past Medical History:  Diagnosis Date  . Chronic back pain   . Chronic midline thoracic back pain 05/25/2017  . Elevated ALT measurement 05/30/2017  . Positive PPD 05/29/2017    Tobacco History: Social History   Tobacco Use  Smoking Status Never Smoker  Smokeless Tobacco Never Used   Counseling given: Yes   Continue to not smoke  Outpatient Encounter Medications as of 09/15/2019  Medication Sig  . omeprazole (PRILOSEC) 20 MG capsule Take 1 capsule (20 mg total) by mouth daily.  . [DISCONTINUED] albuterol (VENTOLIN HFA) 108 (90 Base) MCG/ACT inhaler Inhale 2 puffs into the lungs every 6 (six) hours as needed for wheezing or shortness of breath. (Patient not taking: Reported on 08/19/2019)  . [DISCONTINUED] fluticasone (FLONASE) 50 MCG/ACT nasal spray Place 2 sprays into both nostrils daily. (Patient not taking: Reported on 08/19/2019)  . [DISCONTINUED] meloxicam (MOBIC) 15 MG tablet One tab PO qAM with a meal for 2 weeks, then  daily prn pain. (Patient not taking: Reported on 07/01/2019)  . [DISCONTINUED] nortriptyline (PAMELOR) 25 MG capsule Take 1 capsule (25 mg total) by mouth at bedtime. (Patient not taking: Reported on 08/19/2019)   No facility-administered encounter medications on file as of 09/15/2019.     Review of Systems  Review of Systems  Constitutional: Positive for fatigue. Negative for activity change, chills, fever and unexpected weight change.  HENT: Negative for postnasal drip, rhinorrhea, sinus pressure, sinus pain and sore throat.   Eyes: Negative.   Respiratory: Positive for cough (productive clear). Negative for shortness of breath and wheezing.   Cardiovascular: Negative for chest pain and palpitations.  Gastrointestinal: Negative for constipation, diarrhea, nausea and vomiting.  Endocrine: Negative.   Genitourinary: Negative.   Musculoskeletal: Negative.   Skin: Negative.   Neurological: Negative for dizziness and headaches.  Psychiatric/Behavioral: Negative.  Negative for dysphoric mood. The patient is not nervous/anxious.   All other systems reviewed and are negative.    Physical Exam  BP 128/86 (BP Location: Left Arm, Patient Position: Sitting, Cuff Size: Normal)   Pulse 91   Temp 98.4 F (36.9 C) (Temporal)   Ht 5' 3.5" (1.613 m)   Wt 178 lb 14.4 oz (81.1 kg)   SpO2 98% Comment: on RA  BMI 31.19 kg/m   Wt Readings from Last 5 Encounters:  09/15/19 178 lb 14.4 oz (81.1 kg)  08/19/19 178 lb (80.7 kg)  06/18/19 175 lb 3.2 oz (79.5 kg)  05/12/19 177 lb (80.3 kg)  10/30/18 178 lb (80.7 kg)    BMI Readings from Last 5 Encounters:  09/15/19 31.19 kg/m  08/19/19 31.04 kg/m  06/18/19 29.15 kg/m  05/12/19 28.57 kg/m  10/30/18 28.73 kg/m     Physical Exam Vitals and nursing note reviewed.  Constitutional:      General: He is not in acute distress.    Appearance: Normal appearance. He is obese.  HENT:     Head: Normocephalic and atraumatic.     Right Ear: Hearing,  tympanic membrane, ear canal and external ear normal. There is no impacted cerumen.     Left Ear: Hearing, tympanic membrane, ear canal and external ear normal. There is no impacted cerumen.     Nose: Rhinorrhea present. No mucosal edema.     Right Turbinates: Not enlarged.     Left Turbinates: Not enlarged.     Mouth/Throat:     Mouth: Mucous membranes are dry.     Pharynx: Oropharynx is clear. No oropharyngeal exudate.     Comments: Post nasal drip  Eyes:     Pupils: Pupils are equal, round, and reactive to light.  Cardiovascular:     Rate and Rhythm: Normal rate and regular rhythm.     Pulses: Normal pulses.     Heart sounds: Normal heart sounds. No murmur.  Pulmonary:     Effort: Pulmonary effort is normal.  Breath sounds: Normal breath sounds. No decreased breath sounds, wheezing or rales.  Musculoskeletal:     Cervical back: Normal range of motion.     Right lower leg: No edema.     Left lower leg: No edema.  Lymphadenopathy:     Cervical: No cervical adenopathy.  Skin:    General: Skin is warm and dry.     Capillary Refill: Capillary refill takes less than 2 seconds.     Findings: No erythema or rash.  Neurological:     General: No focal deficit present.     Mental Status: He is alert and oriented to person, place, and time.     Motor: No weakness.     Coordination: Coordination normal.     Gait: Gait is intact. Gait normal.  Psychiatric:        Mood and Affect: Mood normal.        Behavior: Behavior normal. Behavior is cooperative.        Thought Content: Thought content normal.        Judgment: Judgment normal.       Assessment & Plan:   Chronic cough Ongoing chronic cough History of latent TB Recent concern for active TB ruled negative by evaluation at [facility] department Rhinorrhea and postnasal drip on exam Active acid reflux  Plan: Pulmonary function testing ordered Start daily antihistamine Start omeprazole Reviewed acid reflux  diet medic [place] treatment Provided educational information on AVS in Spanish for patient regarding allergic rhinitis as well as acid reflux as well as a diet for acid reflux  History of latent tuberculosis Recently evaluated at [facility] department and found to be negative for active TB as of April/2021 per chart review  Plan: Continue clinically monitor Pulmonary function tests ordered  Shortness of breath Ongoing shortness of breath Cyclical coughing Rhinorrhea and postnasal drip on exam Worsened breathing winter months Difficult to fully ascertain medical history given interpreter as well as potential low health literacy Chest x-ray shows scattered calcified granulomas  Plan: Pulmonary function test ordered     Return in about 6 weeks (around 10/27/2019), or if symptoms worsen or fail to improve, for Follow up for PFT, Follow up with Elisha Headland FNP-C.   Coral Ceo, NP 09/15/2019   This appointment required 35 minutes of patient care (this includes precharting, chart review, review of results, face-to-face care, etc.).

## 2019-09-15 NOTE — Assessment & Plan Note (Signed)
Ongoing shortness of breath Cyclical coughing Rhinorrhea and postnasal drip on exam Worsened breathing winter months Difficult to fully ascertain medical history given interpreter as well as potential low health literacy Chest x-ray shows scattered calcified granulomas  Plan: Pulmonary function test ordered

## 2019-09-16 LAB — IGE: IgE (Immunoglobulin E), Serum: 9 kU/L (ref <=114)

## 2019-09-17 NOTE — Progress Notes (Signed)
Patient identification verified. Results of recent lab work reviewed. Per Elisha Headland FNP,  Lab work has come back. IgE is normal at 9. Eosinophil count which is mildly elevated at 200.  Please proceed forward with pulmonary function testing as planned.  Patient verbalized understanding of results and plan of care.

## 2019-09-28 NOTE — Progress Notes (Signed)
Reviewed note and agree with plan. 65M with hx of TB with recent work-up negative 08/2019 at health department. Seen for chronic cough and shortness of breath. PFTs and follow-up arranged.  Mechele Collin, M.D. [facility] Pulmonary/Critical Care Medicine 09/28/2019 1:05 PM

## 2019-11-01 ENCOUNTER — Other Ambulatory Visit (HOSPITAL_COMMUNITY)
Admission: RE | Admit: 2019-11-01 | Discharge: 2019-11-01 | Disposition: A | Discharge status: Home / Self Care | Payer: BC Managed Care – PPO | Source: Ambulatory Visit | Attending: Pulmonary Disease | Admitting: Pulmonary Disease

## 2019-11-01 DIAGNOSIS — Z20822 Contact with and (suspected) exposure to covid-19: Secondary | ICD-10-CM | POA: Insufficient documentation

## 2019-11-01 DIAGNOSIS — Z01812 Encounter for preprocedural laboratory examination: Secondary | ICD-10-CM | POA: Diagnosis not present

## 2019-11-01 LAB — SARS CORONAVIRUS 2 (TAT 6-24 HRS): SARS Coronavirus 2: NEGATIVE

## 2019-11-04 DIAGNOSIS — R300 Dysuria: Secondary | ICD-10-CM | POA: Diagnosis not present

## 2019-11-04 DIAGNOSIS — M545 Low back pain: Secondary | ICD-10-CM | POA: Diagnosis not present

## 2019-11-04 DIAGNOSIS — R102 Pelvic and perineal pain: Secondary | ICD-10-CM | POA: Diagnosis not present

## 2019-11-05 ENCOUNTER — Ambulatory Visit (INDEPENDENT_AMBULATORY_CARE_PROVIDER_SITE_OTHER): Payer: BC Managed Care – PPO | Admitting: Pulmonary Disease

## 2019-11-05 ENCOUNTER — Other Ambulatory Visit: Payer: Self-pay

## 2019-11-05 ENCOUNTER — Encounter: Payer: Self-pay | Admitting: Pulmonary Disease

## 2019-11-05 ENCOUNTER — Ambulatory Visit (INDEPENDENT_AMBULATORY_CARE_PROVIDER_SITE_OTHER): Payer: BC Managed Care – PPO

## 2019-11-05 VITALS — BP 134/86 | HR 112 | Temp 98.8°F | Ht 65.0 in | Wt 176.0 lb

## 2019-11-05 DIAGNOSIS — R053 Chronic cough: Secondary | ICD-10-CM

## 2019-11-05 DIAGNOSIS — R05 Cough: Secondary | ICD-10-CM | POA: Diagnosis not present

## 2019-11-05 DIAGNOSIS — R911 Solitary pulmonary nodule: Secondary | ICD-10-CM

## 2019-11-05 DIAGNOSIS — Z8615 Personal history of latent tuberculosis infection: Secondary | ICD-10-CM

## 2019-11-05 LAB — PULMONARY FUNCTION TEST
DL/VA % pred: 137 %
DL/VA: 7.04 ml/min/mmHg/L
DLCO cor % pred: 122 %
DLCO cor: 33.76 ml/min/mmHg
DLCO unc % pred: 125 %
DLCO unc: 34.59 ml/min/mmHg
FEF 25-75 Post: 6.2 L/s
FEF 25-75 Pre: 5.13 L/s
FEF2575-%Change-Post: 20 %
FEF2575-%Pred-Post: 150 %
FEF2575-%Pred-Pre: 124 %
FEV1-%Change-Post: 5 %
FEV1-%Pred-Post: 92 %
FEV1-%Pred-Pre: 88 %
FEV1-Post: 3.57 L
FEV1-Pre: 3.4 L
FEV1FVC-%Change-Post: 2 %
FEV1FVC-%Pred-Pre: 108 %
FEV6-%Change-Post: 2 %
FEV6-%Pred-Post: 84 %
FEV6-%Pred-Pre: 82 %
FEV6-Post: 3.9 L
FEV6-Pre: 3.79 L
FEV6FVC-%Pred-Post: 100 %
FEV6FVC-%Pred-Pre: 100 %
FVC-%Change-Post: 2 %
FVC-%Pred-Post: 84 %
FVC-%Pred-Pre: 81 %
FVC-Post: 3.9 L
FVC-Pre: 3.79 L
Post FEV1/FVC ratio: 92 %
Post FEV6/FVC ratio: 100 %
Pre FEV1/FVC ratio: 90 %
Pre FEV6/FVC Ratio: 100 %
RV % pred: 103 %
RV: 1.3 L
TLC % pred: 92 %
TLC: 5.37 L

## 2019-11-05 MED ORDER — BREO ELLIPTA 100-25 MCG/INH IN AEPB: 1.0000 | INHALATION_SPRAY | Freq: Every day | RESPIRATORY_TRACT | 0 refills | Status: DC

## 2019-11-05 NOTE — Assessment & Plan Note (Signed)
Recently evaluated at [facility] department and found to be negative for active TB as of April/2021 per chart review  Reviewed pulmonary function testing  Plan: Chest x-ray today Continue to clinically monitor May need to consider CT axial imaging

## 2019-11-05 NOTE — Assessment & Plan Note (Signed)
Ongoing chronic cough History of latent TB Recent concern for active TB ruled negative by evaluation at [facility] department Rhinorrhea and postnasal drip on exam Maintained on omeprazole Maintained on daily allergy pill Reviewed pulmonary function testing with patient today, mid flow reversibility as well as hyperinflated DLCO Blood work shows normal IgE, elevated eosinophil count  Discussion: We will trial Breo Ellipta inhaler.  With patient's mid flow reversibility, hyperinflated DLCO, chronic cough as well as mildly elevated eosinophil count can treat this is cough variant asthma  Plan: Continue daily histamine Continue omeprazole Trial of Breo Ellipta 100 Chest x-ray today May need to consider CT imaging if cough persists

## 2019-11-05 NOTE — Progress Notes (Signed)
Discussed results with patient in office.  Nothing further is needed at this time.  Rigdon Macomber FNP  

## 2019-11-05 NOTE — Progress Notes (Signed)
Small calcified granulomas noted in the lung base.  Would recommend obtaining CT axial imaging.Please place order for CT of chest without Reino Kent, FNP

## 2019-11-05 NOTE — Progress Notes (Signed)
Patient seen in the office today and instructed on use of Breo Ellipta Inhaler.  Patient expressed understanding and demonstrated technique.

## 2019-11-05 NOTE — Patient Instructions (Addendum)
You were seen today by Lauraine Rinne, NP  for:   1. Chronic cough  - DG Chest 2 View; Future  Trial of Breo Ellipta 100 >>> Take 1 puff daily in the morning right when you wake up >>>Rinse your mouth out after use >>>This is a daily maintenance inhaler, NOT a rescue inhaler >>>Contact our office if you are having difficulties affording or obtaining this medication >>>It is important for you to be able to take this daily and not miss any doses   Call us or send [place] a message in 1 to 2 weeks after finishing this inhaler and let us know how you are doing on it.  If you tolerated this well that we can send in a prescription  Continue omeprazole  Continue daily allergy pill  May decide on CT chest if cough continues with no improvement after inhaler   Referred to primary care  We recommend today:  Orders Placed This Encounter  Procedures  . DG Chest 2 View    Standing Status:   Future    Number of Occurrences:   1    Standing Expiration Date:   03/06/2020    Order Specific Question:   Reason for Exam (SYMPTOM  OR DIAGNOSIS REQUIRED)    Answer:   cough    Order Specific Question:   Preferred imaging location?    Answer:   Internal    Order Specific Question:   Radiology Contrast Protocol - do NOT remove file path    Answer:   [email or web address]   Orders Placed This Encounter  Procedures  . DG Chest 2 View   No orders of the defined types were placed in this encounter.   Follow Up:    Return in about 2 months (around 01/05/2020), or if symptoms worsen or fail to improve, for Follow up with Dr. Loanne Drilling.   Please do your part to reduce the spread of COVID-19:      Reduce your risk of any infection  and COVID19 by using the similar precautions used for avoiding the common cold or flu:  Marland Kitchen Wash your hands often with soap and warm water for at least 20 seconds.  If soap and water are not readily available, use an alcohol-based hand  sanitizer with at least 60% alcohol.  . If coughing or sneezing, cover your mouth and nose by coughing or sneezing into the elbow areas of your shirt or coat, into a tissue or into your sleeve (not your hands). Langley Gauss A MASK when in public  . Avoid shaking hands with others and consider head nods or verbal greetings only. . Avoid touching your eyes, nose, or mouth with unwashed hands.  . Avoid close contact with people who are sick. . Avoid places or events with large numbers of people in one location, like concerts or sporting events. . If you have some symptoms but not all symptoms, continue to monitor at home and seek medical attention if your symptoms worsen. . If you are having a medical emergency, call 911.   [place] / e-Visit: [email or web address]         [facility]: [phone]  [facility]: [phone]                   [facility]: [phone]     It is flu season:   >>> Best ways to protect herself from the flu: Receive the  yearly flu vaccine, practice good hand hygiene washing with soap and also using hand sanitizer when available, eat a nutritious meals, get adequate rest, hydrate appropriately   Please contact the office if your symptoms worsen or you have concerns that you are not improving.   Thank you for choosing [facility] for your healthcare, and for allowing [place] to partner with you on your healthcare journey. I am thankful to be able to provide care to you today.   Elisha Headland FNP-C

## 2019-11-05 NOTE — Progress Notes (Signed)
@Patient  ID: Isaac Myers, male    DOB: [birth year], 29 y.o.   MRN: [id number]  Chief Complaint  Patient presents with  . Follow-up    Cough    Referring provider: No ref. provider found  HPI:  29 year old male never smoker referred to our office in February/2021 for history of latent TB, chronic cough as well as recent TB exposure  PMH: Mood disorder, overactive bladder, degenerative disc disease Smoker/ Smoking History: Never Smoker Maintenance: None Pt of: Dr. Loanne Drilling  11/05/2019  - Visit   29 year old male never smoker followed in our office for chronic cough.  Patient was last seen in May/2021.  Plan of care at that time was to obtain pulmonary function testing and start a daily allergy pill as well as start acid reflux medications patient presenting today after completing pulmonary function testing those results are listed below:  11/05/2019-pulmonary function testing-FVC 3.79 (81% addicted), postbronchodilator ratio 92, postbronchodilator FEV1 3.57 (92% predicted), no bronchodilator response, TLC 5.37 (92% predicted), DLCO 34.59 (125% predicted)  Previous lab work in May/2021 showed mild peripheral eosinophilia with an eosinophil count of 200 IgE was normal at 9.  Patient reports that he started omeprazole as well as taking a daily allergy pill.  He reports his cough is no different.  Patient reports that he coughs every 20 to 25 minutes.  Patient reports the cough is occasionally dry as well as occasionally productive with yellow mucus.  His last chest imaging was an x-ray in January/2021.  He reports that he has been tried on inhalers in the past but he is unsure whether or not this was a rescue inhaler or maintenance.  He did not find much improvement when using it.  He does feel like that his cough is worse in the mornings.  He also coughs more when drinking cold liquids.  Questionaires / Pulmonary Flowsheets:    MMRC: mMRC Dyspnea Scale mMRC Score  09/15/2019 0     Tests:   08/20/2019-SARS-CoV-2-not detected  Telephone message from April/2021 from the [facility] stating that TB testing was negative, sputum was negative, chest x-ray was clear  05/26/2019-chest x-ray-no active cardiopulmonary disease, again calcified granulomas bilaterally   FENO:  No results found for: NITRICOXIDE  PFT: PFT Results Latest Ref Rng & Units 11/05/2019  FVC-Pre L 3.79  FVC-Predicted Pre % 81  FVC-Post L 3.90  FVC-Predicted Post % 84  Pre FEV1/FVC % % 90  Post FEV1/FCV % % 92  FEV1-Pre L 3.40  FEV1-Predicted Pre % 88  FEV1-Post L 3.57  DLCO UNC% % 125  DLCO COR %Predicted % 137  TLC L 5.37  TLC % Predicted % 92  RV % Predicted % 103    WALK:  No flowsheet data found.  Imaging: No results found.  Lab Results:  CBC    Component Value Date/Time   WBC 10.3 09/15/2019 1009   RBC 5.11 09/15/2019 1009   HGB 15.5 09/15/2019 1009   HCT 45.0 09/15/2019 1009   PLT 208.0 09/15/2019 1009   MCV 88.0 09/15/2019 1009   MCH 29.5 01/25/2018 1529   MCHC 34.4 09/15/2019 1009   RDW 13.4 09/15/2019 1009   LYMPHSABS 2.8 09/15/2019 1009   MONOABS 0.8 09/15/2019 1009   EOSABS 0.2 09/15/2019 1009   BASOSABS 0.0 09/15/2019 1009    BMET    Component Value Date/Time   NA 138 08/19/2019 1406   K 4.2 08/19/2019 1406   CL 101 08/19/2019 1406   CO2 27  08/19/2019 1406   GLUCOSE 93 08/19/2019 1406   BUN 17 08/19/2019 1406   CREATININE 0.81 08/19/2019 1406   CALCIUM 9.8 08/19/2019 1406   GFRNONAA 122 08/19/2019 1406   GFRAA 141 08/19/2019 1406    BNP No results found for: BNP  ProBNP No results found for: PROBNP  Specialty Problems      Pulmonary Problems   Recurrent epistaxis   Chronic cough   Shortness of breath      No Known Allergies  Immunization History  Administered Date(s) Administered  . Influenza,inj,Quad PF,6+ Mos 07/05/2017, 03/26/2018, 01/16/2019  . PPD Test 05/25/2017    Past Medical History:  Diagnosis Date   . Chronic back pain   . Chronic midline thoracic back pain 05/25/2017  . Elevated ALT measurement 05/30/2017  . Positive PPD 05/29/2017    Tobacco History: Social History   Tobacco Use  Smoking Status Never Smoker  Smokeless Tobacco Never Used   Counseling given: Not Answered   Continue to not smoke  Outpatient Encounter Medications as of 11/05/2019  Medication Sig  . omeprazole (PRILOSEC) 20 MG capsule Take 1 capsule (20 mg total) by mouth daily.   No facility-administered encounter medications on file as of 11/05/2019.     Review of Systems  Review of Systems  Constitutional: Negative for activity change, chills, fatigue, fever and unexpected weight change.  HENT: Negative for postnasal drip, rhinorrhea, sinus pressure, sinus pain and sore throat.   Eyes: Negative.   Respiratory: Positive for cough. Negative for shortness of breath and wheezing.   Cardiovascular: Negative for chest pain and palpitations.  Gastrointestinal: Negative for constipation, diarrhea, nausea and vomiting.  Endocrine: Negative.   Genitourinary: Negative.   Musculoskeletal: Negative.   Skin: Negative.   Neurological: Negative for dizziness and headaches.  Psychiatric/Behavioral: Negative.  Negative for dysphoric mood. The patient is not nervous/anxious.   All other systems reviewed and are negative.    Physical Exam  BP 134/86 (BP Location: Left Arm, Cuff Size: Normal)   Pulse (!) 112   Temp 98.8 F (37.1 C) (Oral)   Ht 5\' 5"  (1.651 m)   Wt 176 lb (79.8 kg)   SpO2 98%   BMI 29.29 kg/m   Wt Readings from Last 5 Encounters:  11/05/19 176 lb (79.8 kg)  09/15/19 178 lb 14.4 oz (81.1 kg)  08/19/19 178 lb (80.7 kg)  06/18/19 175 lb 3.2 oz (79.5 kg)  05/12/19 177 lb (80.3 kg)    BMI Readings from Last 5 Encounters:  11/05/19 29.29 kg/m  09/15/19 31.19 kg/m  08/19/19 31.04 kg/m  06/18/19 29.15 kg/m  05/12/19 28.57 kg/m     Physical Exam Vitals and nursing note reviewed.   Constitutional:      General: He is not in acute distress.    Appearance: Normal appearance. He is obese.  HENT:     Head: Normocephalic and atraumatic.     Right Ear: Hearing, tympanic membrane, ear canal and external ear normal. There is no impacted cerumen.     Left Ear: Hearing, tympanic membrane, ear canal and external ear normal. There is no impacted cerumen.     Nose: Congestion and rhinorrhea present. No mucosal edema.     Right Turbinates: Not enlarged.     Left Turbinates: Not enlarged.     Mouth/Throat:     Mouth: Mucous membranes are dry.     Pharynx: Oropharynx is clear. No oropharyngeal exudate.     Comments: mallampati 4 Eyes:  Pupils: Pupils are equal, round, and reactive to light.  Cardiovascular:     Rate and Rhythm: Normal rate and regular rhythm.     Pulses: Normal pulses.     Heart sounds: Normal heart sounds. No murmur heard.   Pulmonary:     Effort: Pulmonary effort is normal.     Breath sounds: Normal breath sounds. No decreased breath sounds, wheezing or rales.  Musculoskeletal:     Cervical back: Normal range of motion.     Right lower leg: No edema.     Left lower leg: No edema.  Lymphadenopathy:     Cervical: No cervical adenopathy.  Skin:    General: Skin is warm and dry.     Capillary Refill: Capillary refill takes less than 2 seconds.     Findings: No erythema or rash.  Neurological:     General: No focal deficit present.     Mental Status: He is alert and oriented to person, place, and time.     Motor: No weakness.     Coordination: Coordination normal.     Gait: Gait is intact. Gait normal.  Psychiatric:        Mood and Affect: Mood normal.        Behavior: Behavior normal. Behavior is cooperative.        Thought Content: Thought content normal.        Judgment: Judgment normal.       Assessment & Plan:   Chronic cough Ongoing chronic cough History of latent TB Recent concern for active TB ruled negative by evaluation at  [facility] department Rhinorrhea and postnasal drip on exam Maintained on omeprazole Maintained on daily allergy pill Reviewed pulmonary function testing with patient today, mid flow reversibility as well as hyperinflated DLCO Blood work shows normal IgE, elevated eosinophil count  Discussion: We will trial Breo Ellipta inhaler.  With patient's mid flow reversibility, hyperinflated DLCO, chronic cough as well as mildly elevated eosinophil count can treat this is cough variant asthma  Plan: Continue daily histamine Continue omeprazole Trial of Breo Ellipta 100 Chest x-ray today May need to consider CT imaging if cough persists   History of latent tuberculosis Recently evaluated at [facility] department and found to be negative for active TB as of April/2021 per chart review  Reviewed pulmonary function testing  Plan: Chest x-ray today Continue to clinically monitor May need to consider CT axial imaging     Return in about 2 months (around 01/05/2020), or if symptoms worsen or fail to improve, for Follow up with Dr. Everardo All.   Coral Ceo, NP 11/05/2019   This appointment required 32 minutes of patient care (this includes precharting, chart review, review of results, face-to-face care, etc.).

## 2019-11-05 NOTE — Progress Notes (Signed)
Full PFT performed today. °

## 2019-11-13 NOTE — Progress Notes (Signed)
Spoke with Cordelia Pen regarding this order. It was entered into a different workque, but has been corrected and will now be scheduled. Will hold to ensure.

## 2019-11-13 NOTE — Progress Notes (Signed)
I am checking with [facility] about the status of CT being scheduled and will f/u

## 2019-11-14 ENCOUNTER — Ambulatory Visit (INDEPENDENT_AMBULATORY_CARE_PROVIDER_SITE_OTHER)
Admission: RE | Admit: 2019-11-14 | Discharge: 2019-11-14 | Disposition: A | Discharge status: Home / Self Care | Payer: BC Managed Care – PPO | Source: Ambulatory Visit | Attending: Pulmonary Disease | Admitting: Pulmonary Disease

## 2019-11-14 ENCOUNTER — Other Ambulatory Visit: Payer: Self-pay

## 2019-11-14 DIAGNOSIS — J8410 Pulmonary fibrosis, unspecified: Secondary | ICD-10-CM | POA: Diagnosis not present

## 2019-11-14 DIAGNOSIS — R053 Chronic cough: Secondary | ICD-10-CM

## 2019-11-14 DIAGNOSIS — R911 Solitary pulmonary nodule: Secondary | ICD-10-CM | POA: Diagnosis not present

## 2019-11-18 NOTE — Progress Notes (Signed)
Results documented and sent to triage via result note on CT chest.No new recommendations at this time.Elisha Headland, FNP

## 2019-11-20 ENCOUNTER — Other Ambulatory Visit: Payer: BC Managed Care – PPO

## 2019-11-27 ENCOUNTER — Telehealth: Payer: Self-pay | Admitting: Pulmonary Disease

## 2019-11-27 DIAGNOSIS — M545 Low back pain: Secondary | ICD-10-CM | POA: Diagnosis not present

## 2019-11-27 DIAGNOSIS — R102 Pelvic and perineal pain: Secondary | ICD-10-CM | POA: Diagnosis not present

## 2019-11-27 DIAGNOSIS — R300 Dysuria: Secondary | ICD-10-CM | POA: Diagnosis not present

## 2019-11-27 MED ORDER — BREO ELLIPTA 100-25 MCG/INH IN AEPB: 1.0000 | INHALATION_SPRAY | Freq: Every day | RESPIRATORY_TRACT | 2 refills | Status: DC

## 2019-11-27 NOTE — Telephone Encounter (Signed)
Spoke with pt's wife, he is taking the [place] but doesn't think it is helping. He ran out of the medication so I suggested he start the [facility] again and let us know how he is doing. I sent the Rx into the pharmacy and made an appt to see Arlys John 01/12/20 at 10am. Nothing further is needed.

## 2020-01-02 ENCOUNTER — Ambulatory Visit (INDEPENDENT_AMBULATORY_CARE_PROVIDER_SITE_OTHER): Payer: BC Managed Care – PPO | Admitting: Medical-Surgical

## 2020-01-02 ENCOUNTER — Encounter: Payer: Self-pay | Admitting: Medical-Surgical

## 2020-01-02 VITALS — BP 123/85 | HR 87 | Temp 98.1°F | Ht 65.0 in | Wt 176.5 lb

## 2020-01-02 DIAGNOSIS — R300 Dysuria: Secondary | ICD-10-CM

## 2020-01-02 LAB — POCT URINALYSIS DIP (CLINITEK)
Bilirubin, UA: NEGATIVE
Glucose, UA: NEGATIVE mg/dL
Ketones, POC UA: NEGATIVE mg/dL
Nitrite, UA: POSITIVE — AB
POC PROTEIN,UA: 100 — AB
Spec Grav, UA: 1.025 (ref 1.010–1.025)
Urobilinogen, UA: 0.2 E.U./dL
pH, UA: 5.5 (ref 5.0–8.0)

## 2020-01-02 MED ORDER — SULFAMETHOXAZOLE-TRIMETHOPRIM 800-160 MG PO TABS: 1.0000 | ORAL_TABLET | Freq: Two times a day (BID) | ORAL | 0 refills | Status: DC

## 2020-01-02 NOTE — Progress Notes (Signed)
Subjective:    CC: dysuria  HPI: Pleasant 29 year old male presenting with complaints of urinary urgency, frequency, suprapubic pain, and low back pain for 1 week. Denies fever, chills, GI upset, and pain with bowel movements. Currently sexually active with his wife, not using condoms. She is diabetic and has frequent yeast infections. He wonders if that is the cause of his symptoms. No other partners for either of them. Has had urinary issues since he was 13 with frequent dysuria that he is usually able to treat with increased water intake and drinking hot tea. He was seen by [facility] and reports they found nothing wrong.   I reviewed the past medical history, family history, social history, surgical history, and allergies today and no changes were needed.  Please see the problem list section below in epic for further details.  Past Medical History: Past Medical History:  Diagnosis Date  . Chronic back pain   . Chronic midline thoracic back pain 05/25/2017  . Elevated ALT measurement 05/30/2017  . Positive PPD 05/29/2017   Past Surgical History: Past Surgical History:  Procedure Laterality Date  . TRIGGER POINT INJECTION     back   Social History: Social History   Socioeconomic History  . Marital status: Married    Spouse name: Laurdes  . Number of children: 3  . Years of education: 9th grade  . Highest education level: Not on file  Occupational History  . Not on file  Tobacco Use  . Smoking status: Never Smoker  . Smokeless tobacco: Never Used  Vaping Use  . Vaping Use: Never used  Substance and Sexual Activity  . Alcohol use: Yes  . Drug use: No  . Sexual activity: Yes  Other Topics Concern  . Not on file  Social History Narrative  . Not on file   Social Determinants of Health   Financial Resource Strain:   . Difficulty of Paying Living Expenses: Not on file  Food Insecurity:   . Worried About Programme researcher, broadcasting/film/video in the Last Year: Not on file  . Ran Out  of Food in the Last Year: Not on file  Transportation Needs:   . Lack of Transportation (Medical): Not on file  . Lack of Transportation (Non-Medical): Not on file  Physical Activity:   . Days of Exercise per Week: Not on file  . Minutes of Exercise per Session: Not on file  Stress:   . Feeling of Stress : Not on file  Social Connections:   . Frequency of Communication with Friends and Family: Not on file  . Frequency of Social Gatherings with Friends and Family: Not on file  . Attends Religious Services: Not on file  . Active Member of Clubs or Organizations: Not on file  . Attends Banker Meetings: Not on file  . Marital Status: Not on file   Family History: Family History  Problem Relation Age of Onset  . Diabetes Mother   . Autoimmune disease Neg Hx    Allergies: No Known Allergies Medications: See med rec.  Review of Systems: See HPI for pertinent positives and negatives.   Objective:    General: Well Developed, well nourished, and in no acute distress.  Neuro: Alert and oriented x3.  HEENT: Normocephalic, atraumatic.  Skin: Warm and dry. Cardiac: Regular rate and rhythm, no murmurs rubs or gallops, no lower extremity edema.  Respiratory: Clear to auscultation bilaterally. Not using accessory muscles, speaking in full sentences. Abdomen: Soft, nondistended. Tenderness to  BLQ with mild tenderness to LUQ. BS + x 4 quadrants. No HSM. No CVA tenderness.   Impression and Recommendations:    1. Dysuria POCT UA positive for protein, nitrites, leukocytes, and moderate blood. Sending for microscopy, culture and testing for chlamydia/gonorrhea. Treating with Bactrim DS BID x 7 days. Increase PO fluids. Tylenol as needed for pain. [facility] note reviewed and it looks like they wanted him to complete 3 months of pelvic physical therapy. Once infection cleared, will revisit this to see if he is willing to try pelvic PT. - POCT URINALYSIS DIP (CLINITEK) - C.  trachomatis/N. gonorrhoeae RNA - Urine Culture - Urine Microscopic  Return in about 1 week (around 01/09/2020) for urine recheck. ___________________________________________ Thayer Ohm, DNP, APRN, FNP-BC Primary Care and Sports Medicine [facility]

## 2020-01-03 LAB — URINALYSIS, MICROSCOPIC ONLY
Hyaline Cast: NONE SEEN /LPF
Squamous Epithelial / HPF: NONE SEEN /HPF (ref <=5)
WBC, UA: >=60 /HPF — AB (ref 0–5)

## 2020-01-04 LAB — C. TRACHOMATIS/N. GONORRHOEAE RNA
C. trachomatis RNA, TMA: NOT DETECTED
N. gonorrhoeae RNA, TMA: NOT DETECTED

## 2020-01-04 LAB — URINE CULTURE
MICRO NUMBER:: 10853095
SPECIMEN QUALITY:: ADEQUATE

## 2020-01-09 ENCOUNTER — Ambulatory Visit: Payer: BC Managed Care – PPO | Admitting: Medical-Surgical

## 2020-01-12 ENCOUNTER — Ambulatory Visit: Payer: BC Managed Care – PPO | Admitting: Pulmonary Disease

## 2020-01-13 ENCOUNTER — Ambulatory Visit: Payer: BC Managed Care – PPO | Admitting: Pulmonary Disease

## 2020-01-13 NOTE — Progress Notes (Deleted)
@Patient  ID: , male    DOB: 1991/04/15, 29 y.o.   MRN: [id number]  No chief complaint on file.   Referring provider: [id number], NP  HPI:  29 year old male never smoker referred to our office in February/2021 for history of latent TB, chronic cough as well as recent TB exposure  PMH: Mood disorder, overactive bladder, degenerative disc disease Smoker/ Smoking History: Never Smoker Maintenance: None Pt of: Dr. March/2021  01/13/2020  - Visit     Questionaires / Pulmonary Flowsheets:   ACT:  No flowsheet data found.  MMRC: mMRC Dyspnea Scale mMRC Score  09/15/2019 0    Epworth:  No flowsheet data found.  Tests:   08/20/2019-SARS-CoV-2-not detected  Telephone message from April/2021 from the [facility] department stating that TB testing was negative, sputum was negative, chest x-ray was clear  05/26/2019-chest x-ray-no active cardiopulmonary disease, again calcified granulomas bilaterally  11/05/2019-pulmonary function testing-FVC 3.79 (81% addicted), postbronchodilator ratio 92, postbronchodilator FEV1 3.57 (92% predicted), no bronchodilator response, TLC 5.37 (92% predicted), DLCO 34.59 (125% predicted)   FENO:  No results found for: NITRICOXIDE  PFT: PFT Results Latest Ref Rng & Units 11/05/2019  FVC-Pre L 3.79  FVC-Predicted Pre % 81  FVC-Post L 3.90  FVC-Predicted Post % 84  Pre FEV1/FVC % % 90  Post FEV1/FCV % % 92  FEV1-Pre L 3.40  FEV1-Predicted Pre % 88  FEV1-Post L 3.57  DLCO uncorrected ml/min/mmHg 34.59  DLCO UNC% % 125  DLCO corrected ml/min/mmHg 33.76  DLCO COR %Predicted % 122  DLVA Predicted % 137  TLC L 5.37  TLC % Predicted % 92  RV % Predicted % 103    WALK:  No flowsheet data found.  Imaging: No results found.  Lab Results:  CBC    Component Value Date/Time   WBC 10.3 09/15/2019 1009   RBC 5.11 09/15/2019 1009   HGB 15.5 09/15/2019 1009   HCT 45.0 09/15/2019 1009   PLT 208.0 09/15/2019 1009   MCV 88.0  09/15/2019 1009   MCH 29.5 01/25/2018 1529   MCHC 34.4 09/15/2019 1009   RDW 13.4 09/15/2019 1009   LYMPHSABS 2.8 09/15/2019 1009   MONOABS 0.8 09/15/2019 1009   EOSABS 0.2 09/15/2019 1009   BASOSABS 0.0 09/15/2019 1009    BMET    Component Value Date/Time   NA 138 08/19/2019 1406   K 4.2 08/19/2019 1406   CL 101 08/19/2019 1406   CO2 27 08/19/2019 1406   GLUCOSE 93 08/19/2019 1406   BUN 17 08/19/2019 1406   CREATININE 0.81 08/19/2019 1406   CALCIUM 9.8 08/19/2019 1406   GFRNONAA 122 08/19/2019 1406   GFRAA 141 08/19/2019 1406    BNP No results found for: BNP  ProBNP No results found for: PROBNP  Specialty Problems      Pulmonary Problems   Recurrent epistaxis   Chronic cough   Shortness of breath      No Known Allergies  Immunization History  Administered Date(s) Administered  . Influenza,inj,Quad PF,6+ Mos 07/05/2017, 03/26/2018, 01/16/2019  . PPD Test 05/25/2017    Past Medical History:  Diagnosis Date  . Chronic back pain   . Chronic midline thoracic back pain 05/25/2017  . Elevated ALT measurement 05/30/2017  . Positive PPD 05/29/2017    Tobacco History: Social History   Tobacco Use  Smoking Status Never Smoker  Smokeless Tobacco Never Used   Counseling given: Not Answered   Continue to not smoke  Outpatient Encounter Medications as of  01/13/2020  Medication Sig  . fluticasone furoate-vilanterol (BREO ELLIPTA) 100-25 MCG/INH AEPB Inhale 1 puff into the lungs daily.  Marland Kitchen omeprazole (PRILOSEC) 20 MG capsule Take 1 capsule (20 mg total) by mouth daily.  Marland Kitchen sulfamethoxazole-trimethoprim (BACTRIM DS) 800-160 MG tablet Take 1 tablet by mouth 2 (two) times daily.   No facility-administered encounter medications on file as of 01/13/2020.     Review of Systems  Review of Systems   Physical Exam  There were no vitals taken for this visit.  Wt Readings from Last 5 Encounters:  01/02/20 176 lb 8 oz (80.1 kg)  11/05/19 176 lb (79.8 kg)    09/15/19 178 lb 14.4 oz (81.1 kg)  08/19/19 178 lb (80.7 kg)  06/18/19 175 lb 3.2 oz (79.5 kg)    BMI Readings from Last 5 Encounters:  01/02/20 29.37 kg/m  11/05/19 29.29 kg/m  09/15/19 31.19 kg/m  08/19/19 31.04 kg/m  06/18/19 29.15 kg/m     Physical Exam    Assessment & Plan:   No problem-specific Assessment & Plan notes found for this encounter.    No follow-ups on file.   Coral Ceo, NP 01/13/2020   This appointment required *** minutes of patient care (this includes precharting, chart review, review of results, face-to-face care, etc.).

## 2020-02-13 ENCOUNTER — Emergency Department (INDEPENDENT_AMBULATORY_CARE_PROVIDER_SITE_OTHER)
Admission: EM | Admit: 2020-02-13 | Discharge: 2020-02-13 | Disposition: A | Discharge status: Home / Self Care | Payer: BC Managed Care – PPO | Source: Home / Self Care

## 2020-02-13 ENCOUNTER — Other Ambulatory Visit: Payer: Self-pay

## 2020-02-13 ENCOUNTER — Emergency Department (INDEPENDENT_AMBULATORY_CARE_PROVIDER_SITE_OTHER): Payer: BC Managed Care – PPO

## 2020-02-13 DIAGNOSIS — M25561 Pain in right knee: Secondary | ICD-10-CM

## 2020-02-13 DIAGNOSIS — L089 Local infection of the skin and subcutaneous tissue, unspecified: Secondary | ICD-10-CM

## 2020-02-13 DIAGNOSIS — B9689 Other specified bacterial agents as the cause of diseases classified elsewhere: Secondary | ICD-10-CM | POA: Diagnosis not present

## 2020-02-13 MED ORDER — DOXYCYCLINE HYCLATE 100 MG PO CAPS: 100.0000 mg | ORAL_CAPSULE | Freq: Two times a day (BID) | ORAL | 0 refills | Status: AC

## 2020-02-13 MED ORDER — MELOXICAM 15 MG PO TABS: 15.0000 mg | ORAL_TABLET | Freq: Every day | ORAL | 0 refills | Status: DC

## 2020-02-13 NOTE — Discharge Instructions (Addendum)
Meloxicam todos los das segn sea necesario para el dolor de rodilla. Aplique hielo para reducir la hinchazn. No se ve ninguna fractura en la radiografa. Si el dolor no se resuelve por completo en 1 semana, haga un seguimiento con [employer] para Hotel manager. Use una rodillera todos los [employer] que el dolor de rodilla se resuelva y pueda caminar y doblar la rodilla sin dolor.   Meloxicam daily as needed for knee pain. Apply ice to reduce swelling. No fracture seen on x-ray. If pain doesn't completely resolve within 1 week follow-up with Primary Care for additional evaluation. Wear knee brace daily until knee pain resolves and you able to walk and bend knee without pain.
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
## 2020-03-14 ENCOUNTER — Other Ambulatory Visit: Payer: Self-pay | Admitting: Family Medicine

## 2020-03-18 ENCOUNTER — Other Ambulatory Visit: Payer: Self-pay

## 2020-03-18 ENCOUNTER — Encounter: Payer: Self-pay | Admitting: Nurse Practitioner

## 2020-03-18 ENCOUNTER — Ambulatory Visit (INDEPENDENT_AMBULATORY_CARE_PROVIDER_SITE_OTHER): Payer: BC Managed Care – PPO | Admitting: Nurse Practitioner

## 2020-03-18 VITALS — BP 126/85 | HR 107 | Temp 98.3°F | Ht 65.0 in | Wt 172.5 lb

## 2020-03-18 DIAGNOSIS — M25561 Pain in right knee: Secondary | ICD-10-CM | POA: Diagnosis not present

## 2020-03-18 MED ORDER — PREDNISONE 20 MG PO TABS: 40.0000 mg | ORAL_TABLET | Freq: Every day | ORAL | 0 refills | Status: AC

## 2020-03-18 NOTE — Progress Notes (Signed)
Acute Office Visit  Subjective:    Patient ID: Isaac Myers, male    DOB: 01-Nov-1990, 29 y.o.   MRN: [id number]  Chief Complaint  Patient presents with  . Follow-up    [facility] on 02/13/2020  . Knee Pain    HPI Isaac Myers is a 29 year old male presenting today for follow-up for right knee pain.  He was last seen in the urgent care on October 1 for injury and pain to his right knee after jumping out of a car and landing on the knee.   X-rays revealed no fracture at that time.  He was instructed to immobilize the knee and utilize ice and heat to help with pain.  He reports that he has been wearing the knee brace until approximately 3 days ago.  He is still experiencing pain on the medial and lateral side of the knee with twisting motion.  He reports he is able to stand and sit without pain and he can walk without pain.  He denies the knee feeling like it is going to give out on him.  Past Medical History:  Diagnosis Date  . Chronic back pain   . Chronic midline thoracic back pain 05/25/2017  . Elevated ALT measurement 05/30/2017  . Positive PPD 05/29/2017    Past Surgical History:  Procedure Laterality Date  . TRIGGER POINT INJECTION     back    Family History  Problem Relation Age of Onset  . Diabetes Mother   . Healthy Father   . Autoimmune disease Neg Hx     Social History   Socioeconomic History  . Marital status: Married    Spouse name: Laurdes  . Number of children: 3  . Years of education: 9th grade  . Highest education level: Not on file  Occupational History  . Not on file  Tobacco Use  . Smoking status: Never Smoker  . Smokeless tobacco: Never Used  Vaping Use  . Vaping Use: Never used  Substance and Sexual Activity  . Alcohol use: Yes    Alcohol/week: 8.0 standard drinks    Types: 8 Cans of beer per week    Comment: on weekends  . Drug use: No  . Sexual activity: Yes  Other Topics Concern  . Not on file  Social History Narrative   . Not on file   Social Determinants of Health   Financial Resource Strain:   . Difficulty of Paying Living Expenses: Not on file  Food Insecurity:   . Worried About Programme researcher, broadcasting/film/video in the Last Year: Not on file  . Ran Out of Food in the Last Year: Not on file  Transportation Needs:   . Lack of Transportation (Medical): Not on file  . Lack of Transportation (Non-Medical): Not on file  Physical Activity:   . Days of Exercise per Week: Not on file  . Minutes of Exercise per Session: Not on file  Stress:   . Feeling of Stress : Not on file  Social Connections:   . Frequency of Communication with Friends and Family: Not on file  . Frequency of Social Gatherings with Friends and Family: Not on file  . Attends Religious Services: Not on file  . Active Member of Clubs or Organizations: Not on file  . Attends Banker Meetings: Not on file  . Marital Status: Not on file  Intimate Partner Violence:   . Fear of Current or Ex-Partner: Not on file  . Emotionally Abused: Not  on file  . Physically Abused: Not on file  . Sexually Abused: Not on file    Outpatient Medications Prior to Visit  Medication Sig Dispense Refill  . acetaminophen (TYLENOL) 325 MG tablet Take 650 mg by mouth every 6 (six) hours as needed (pain).    . fluticasone furoate-vilanterol (BREO ELLIPTA) 100-25 MCG/INH AEPB Inhale 1 puff into the lungs daily. (Patient not taking: Reported on 03/18/2020) 28 each 2  . meloxicam (MOBIC) 15 MG tablet Take 1 tablet (15 mg total) by mouth daily. (Patient not taking: Reported on 03/18/2020) 30 tablet 0  . omeprazole (PRILOSEC) 20 MG capsule Take 1 capsule (20 mg total) by mouth daily. (Patient not taking: Reported on 03/18/2020) 30 capsule 4  . sulfamethoxazole-trimethoprim (BACTRIM DS) 800-160 MG tablet Take 1 tablet by mouth 2 (two) times daily. (Patient not taking: Reported on 03/18/2020) 14 tablet 0   No facility-administered medications prior to visit.    No Known  Allergies  Review of Systems All review of systems negative except what is listed in the HPI     Objective:    Physical Exam Vitals and nursing note reviewed.  Constitutional:      Appearance: Normal appearance. He is normal weight.  HENT:     Head: Normocephalic.  Eyes:     Extraocular Movements: Extraocular movements intact.     Conjunctiva/sclera: Conjunctivae normal.     Pupils: Pupils are equal, round, and reactive to light.  Musculoskeletal:        General: Tenderness and signs of injury present. No swelling. Normal range of motion.     Right knee: No swelling, deformity, effusion, erythema, ecchymosis or crepitus. Normal range of motion. Tenderness present over the MCL and LCL. LCL laxity and MCL laxity present. Normal alignment, normal meniscus and normal patellar mobility.     Left knee: Normal.     Right lower leg: No edema.     Left lower leg: No edema.     Comments: Minimal laxity noted at the medial and lateral joint line of the right knee.  Well-healing scars present to the right shin.  Skin:    General: Skin is warm and dry.     Capillary Refill: Capillary refill takes less than 2 seconds.  Neurological:     General: No focal deficit present.     Mental Status: He is alert and oriented to person, place, and time.  Psychiatric:        Mood and Affect: Mood normal.        Behavior: Behavior normal.        Thought Content: Thought content normal.        Judgment: Judgment normal.     BP 126/85   Pulse (!) 107   Temp 98.3 F (36.8 C) (Oral)   Ht 5\' 5"  (1.651 m)   Wt 172 lb 8 oz (78.2 kg)   SpO2 97%   BMI 28.71 kg/m  Wt Readings from Last 3 Encounters:  03/18/20 172 lb 8 oz (78.2 kg)  01/02/20 176 lb 8 oz (80.1 kg)  11/05/19 176 lb (79.8 kg)    There are no preventive care reminders to display for this patient.  There are no preventive care reminders to display for this patient.   No results found for: TSH Lab Results  Component Value Date    WBC 10.3 09/15/2019   HGB 15.5 09/15/2019   HCT 45.0 09/15/2019   MCV 88.0 09/15/2019   PLT 208.0 09/15/2019  Lab Results  Component Value Date   NA 138 08/19/2019   K 4.2 08/19/2019   CO2 27 08/19/2019   GLUCOSE 93 08/19/2019   BUN 17 08/19/2019   CREATININE 0.81 08/19/2019   BILITOT 0.4 08/19/2019   AST 37 08/19/2019   ALT 93 (H) 08/19/2019   PROT 7.7 08/19/2019   CALCIUM 9.8 08/19/2019   No results found for: CHOL No results found for: HDL No results found for: LDLCALC No results found for: TRIG No results found for: CHOLHDL No results found for: MPNT6R     Assessment & Plan:   Problem List Items Addressed This Visit      Other   Acute pain of right knee - Primary    Ongoing pain to the right knee after injury approximately 1 month ago. There is tenderness noted on the medial and lateral joint line as well as minimal laxity noted greater medially than laterally. At this time I do not feel an MRI is necessary-range of motion is excellent and patient is able to ambulate without problem. Discussed with the patient that the residual pain is most likely related to the damage that occurred to the ligaments of the knee and that this should improve with time.  Discussed the option of formal physical therapy vs. at home exercises. He would like to try exercises at home first to see if this is effective. Discussed the use of ice and heat to the joint to help with pain and any inflammation that may present.  Short burst of prednisone sent for inflammation and information provided on home exercises for ligament strain/sprain to the knee.  Recommend NSAIDs for pain after steroid is complete.  If symptoms do not improve after 2-3 weeks of at home therapy, please return to clinic for evaluation with Dr. Karie Schwalbe for further evaluation.       Relevant Medications   predniSONE (DELTASONE) 20 MG tablet       Meds ordered this encounter  Medications  . predniSONE (DELTASONE) 20 MG  tablet    Sig: Take 2 tablets (40 mg total) by mouth daily with breakfast for 5 days.    Dispense:  10 tablet    Refill:  0     Tollie Eth, NP

## 2020-03-18 NOTE — Patient Instructions (Signed)
Esguince del ligamento lateral interno de la rodilla, rehabilitacin faseI Medial Collateral Knee Ligament Sprain, Phase I Rehab Pregunte al mdico qu ejercicios son seguros para usted. Haga los ejercicios exactamente como se lo haya indicado el mdico y gradelos como se lo hayan indicado. Es normal sentir un estiramiento, tironeo, opresin o Dentist leve al hacer estos ejercicios. Detngase de inmediato si siente un dolor repentino o Community education officer. No comience a hacer estos ejercicios hasta que se lo indique el mdico. Ejercicios de elongacin y amplitud de movimiento Estos ejercicios calientan los msculos y las articulaciones, y mejoran la movilidad y la flexibilidad de la rodilla. Adems, ayudan a Engineer, materials. Flexin de rodilla, pasivo 1. Comience este ejercicio en una de estas posiciones: ? Acostado en el suelo frente a una puerta abierta; con el taln del pie izquierdo/derecho apenas tocando la pared. ? Acostado en el suelo con ambos pies contra la pared. 2. Sin hacer esfuerzo (pasivo), permita que la gravedad haga descender su pie por la pared lentamente (flexin)hasta sentir un ligero estiramiento en la parte de adelante de la rodilla. 3. Mantenga este estiramiento durante __________ segundos. 4. Regrese la pierna a la posicin inicial con la ayuda de la pierna sana, si lo necesita. Repita __________ veces. Realice este ejercicio __________ veces al da. Flexin de la rodilla, activo  1. Acustese boca arriba con las piernas extendidas. Si esto le ocasiona molestias en la espalda, doble la rodilla sana, apoyando el pie en el suelo. 2. Con su propio esfuerzo (activo), lentamente acerque el taln izquierdo/derecho hacia las nalgas (flexin). Detngase cuando sienta un leve estiramiento en la parte de adelante de la rodilla o del muslo. 3. Mantenga esta posicin durante __________ segundos. 4. Deslice lentamente el taln izquierdo/derecho hasta la posicin inicial. Repita  __________ veces. Realice este ejercicio __________ veces al da. Extensin de rodilla, sentado 1. Sintese con el taln izquierdo/derecho apoyado en una silla, mesita o taburete para los pies. No tenga nada debajo de la rodilla para sostenerla. 2. Relaje los msculos de la pierna, dejando que la gravedad extienda la rodilla (extensin). No deje que le rodilla se mueva hacia adentro. Debe sentir un estiramiento detrs de la rodilla izquierda/derecha. 3. Si se lo indic el mdico, coloque un peso de __________ en el muslo, justo encima de la rtula, para lograr un estiramiento ms profundo. 4. Mantenga esta posicin durante __________ segundos. Repita __________ veces. Realice este ejercicio __________ veces al da. Ejercicios de fortalecimiento Estos ejercicios fortalecen la rodilla y Horticulturist, commercial resistencia. La resistencia es la capacidad de usar los msculos durante un tiempo prolongado, incluso despus de que se cansen. Los ejercicios isomtricos involucran apretar los msculos sin mover la articulacin de la rodilla. Cudriceps, ejercicio isomtrico  1. Recustese boca arriba con la pierna izquierda/derecha extendida y la rodilla opuesta flexionada. Si se lo indic el mdico, coloque una toalla enrollada o una almohada pequea debajo de la rodilla izquierda/derecha. 2. Tensione lentamente los msculos de la parte de adelante del muslo izquierdo/derecho (cudriceps). Deber ver la rtula que se desliza para arriba hacia la cadera o que se profundizan los hoyuelos justo por encima de la rodilla. Este movimiento KB Home	[place] parte de atrs de la rodilla hacia el suelo. 3. Mantenga el msculo tan apretado como pueda sin aumentar el dolor durante __________ segundos. 4. Relaje los msculos lentamente y por completo. Repita __________ veces. Realice este ejercicio __________ veces al da. Isquiotibiales, isomtrico  1. Acustese boca arriba sobre una superficie firme. 2. Flexione  la rodilla  izquierda/derecha aproximadamente __________ Danelle Berrygrados. Puede apoyar la rodilla sobre un almohadn si lo necesita. 3. Clave el taln izquierdo/derecho en la superficie haciendo presin hacia abajo y [place] atrs, como si estuviera tratando de tirar de l Omnicarehacia las nalgas. Tensione los Exelon Corporationmsculos de la parte posterior de los muslos (isquiotibiales) para presionar tanto como pueda, sin Tax inspectoraumentar el dolor. 4. Mantenga esta posicin durante __________ segundos. 5. Relaje los msculos lentamente y por completo. Repita __________ veces. Realice este ejercicio __________ veces al da. Esta informacin no tiene Theme park managercomo fin reemplazar el consejo del mdico. Asegrese de hacerle al mdico cualquier pregunta que tenga. Document Revised: 04/02/2018 Document Reviewed: 04/02/2018 Elsevier Patient Education  2020 [employer]    Esguince del ligamento lateral interno de la rodilla, rehabilitacin faseII Medial Collateral Knee Ligament Sprain, Phase II Rehab Estos ejercicios son ms avanzados que los ejercicios de faseI. Pregunte al mdico qu ejercicios son seguros para usted. Haga los ejercicios exactamente como se lo haya indicado el mdico y gradelos como se lo hayan indicado. Es normal sentir un estiramiento, tironeo, opresin o Dentistmalestar leve al hacer estos ejercicios. Detngase de inmediato si siente un dolor repentino o Community education officerel dolor empeora. No comience a hacer estos ejercicios hasta que se lo indique el mdico. Ejercicios de fortalecimiento Estos ejercicios fortalecen la rodilla y Horticulturist, commercialle otorgan resistencia. La resistencia es la capacidad de usar los msculos durante un tiempo prolongado, incluso despus de que se cansen. Elevaciones con pierna extendida, decbito supino Este ejercicio fortalece los msculos de la parte de adelante del muslo (cudriceps). 1. Recustese boca arriba (decbito supino) con la pierna izquierda/derecha extendida y la rodilla opuesta flexionada. 2. Tensione los msculos de la parte de  adelante del muslo izquierdo/derecho. Deber ver la rtula de la rodilla que se desliza hacia arriba o que se profundizan los hoyuelos justo por encima de la rodilla. 3. Mantenga estos msculos contrados mientras levanta la pierna a una altura de 4 a 6pulgadas (10 a 15cm) del suelo. No deje que la rodilla se flexione. 4. Mantenga esta posicin durante __________ segundos. 5. Con los msculos contrados, baje la pierna. 6. Relaje los msculos lenta y completamente entre cada repeticin. Repita __________ veces. Realice este ejercicio __________ veces al da. Isquiotibiales con flexin Realice este ejercicio con pesas en los tobillos si se lo ha indicado el mdico. Comience con pesas de __________ Rebeca Alertlibras. Luego aumente el peso en incrementos de Protivin1libra (0.5kg). No use pesas que pesen ms de __________ Rebeca Alertlibras. 1. Recustese boca abajo con las piernas extendidas. 2. Flexione la rodilla izquierda/derecha hasta donde pueda hacerlo con comodidad. Mantenga la cadera apoyada contra el suelo. 3. Mantenga esta posicin durante __________ segundos. 4. Baje lentamente la pierna hasta la posicin inicial. Repita __________ veces. Realice este ejercicio __________ veces al da. Deslizamientos sobre la pared MirantEste ejercicio fortalece los msculos de la parte de adelante del muslo (cudriceps). 1. Apoye la espalda contra una pared o una puerta, pero mantenga los pies a una distancia de entre 18 y 24pulgadas (46 y 61cm) de la pared o Engineering geologistpuerta. 2. Separe los pies al ancho de caderas. 3. Deslcese lentamente hacia abajo, contra la pared o la puerta, de modo que sus rodillas queden flexionadas a __________ Danelle Berrygrados. Mantenga las Kohl'srodillas sobre los talones, no sobre la punta de los pies. No deje que las rodillas se muevan hacia adentro. 4. Mantenga esta posicin durante __________ segundos. 5. Use los msculos de la parte de adelante del muslo para empujarse hacia arriba hasta ponerse  de pie. 6. Descanse durante  __________ segundos despus de cada repeticin. Repita __________ veces. Realice este ejercicio __________ veces al da. Elevaciones con pierna extendida, acostado de lado Este ejercicio fortalece los msculos que rotan la pierna a la altura de la cadera y la alejan del cuerpo (abductores de la cadera). 1. Recustese de costado con la pierna izquierda/derecha arriba. Recustese de KB Home	[place] cabeza, los hombros, la rodilla y la cadera estn alineados. Puede flexionar la rodilla de abajo para mantener el equilibrio. 2. Mueva ligeramente las caderas [address], de modo que queden alineadas y la rodilla izquierda/derecha quede hacia adelante. 3. Con el taln como gua, levante la pierna de 4 a 6pulgadas (10 a 15cm). Debe sentir que se elevan los msculos de la parte externa de la cadera. ? No lleve el pie hacia adelante. ? No gire la rodilla hacia el techo. 4. Mantenga esta posicin durante __________ segundos. 5. Vuelva lentamente a la posicin inicial. 6. Relaje los msculos completamente. Repita __________ veces. Realice este ejercicio __________ veces al da. Almeja Este ejercicio fortalece los msculos que mueven la cadera, el muslo y la rodilla (abductores de la cadera) y los msculos que mueven la pierna en la articulacin de la cadera (rotadores externos). Realice este ejercicio de ambos lados, segn las indicaciones del mdico. 1. Recustese sobre el lado izquierdo/derecho con las rodillas flexionadas unos 60grados. 2. Coloque una pierna sobre la otra y Omnicare. Puede colocar una [employer] las rodillas para mayor comodidad. 3. Manteniendo los pies juntos, suba y baje la rodilla de [place]. No ruede hacia atrs. 4. Mantenga esta posicin durante __________ segundos. 5. Regrese a la posicin inicial. Repita __________ veces. Realice este ejercicio __________ veces al da. Puente Este es un ejercicio en el que se estiran los msculos de la rodilla y el muslo (extensores de  la cadera). Si este ejercicio le resulta muy fcil, trate de levantar una pierna de 2 a 4pulgadas (5 a 10cm) del suelo con la cadera levantada. 1. Recustese boca arriba sobre una superficie firme con las rodillas flexionadas y los pies completamente apoyados en el suelo. 2. Tensione los msculos abdominales y de las Reardan, y eleve las nalgas hasta que el tronco quede a la misma altura que los muslos. ? No arquee la espalda. ? Debe sentir el trabajo muscular en las nalgas y la parte de atrs de los muslos. 3. Mantenga esta posicin durante __________ segundos. 4. Baje lentamente la cadera a la posicin inicial. 5. Relaje los msculos por completo entre repeticiones. Repita __________ veces. Realice este ejercicio __________ veces al da. Esta informacin no tiene Theme park manager el consejo del mdico. Asegrese de hacerle al mdico cualquier pregunta que tenga. Document Revised: 04/02/2018 Document Reviewed: 04/02/2018 Elsevier Patient Education  2020 Elsevier Inc.  Esguince del ligamento lateral externo de la rodilla Lateral Collateral Knee Ligament Sprain  El ligamento lateral externo (LLE) es una banda resistente de tejido que conecta el fmur al ms pequeo de los huesos de la parte inferior de la pierna. Est ubicado en la parte exterior de la rodilla y ayuda a Pharmacologist la estabilidad de la rodilla. Un esguince de LLE es una distensin o un desgarro del LLE. Cules son las causas? Esta afeccin puede ser causada por lo siguiente:  Un golpe fuerte y directo (traumatismo) en la parte externa de la rodilla.  Correr y Multimedia programmer de direccin sbitamente.  Torcerse la rodilla bruscamente. Qu incrementa el riesgo? Los siguientes factores pueden hacer que  sea ms propenso a contraer esta afeccin:  Practicar deportes de contacto que involucren correr y Multimedia programmer de direccin sbitamente, como ftbol o ftbol americano.  Practicar deportes que impliquen un riesgo de torcerse la  rodilla, como esqu o [place].  Tener msculos dbiles.  Tener mala coordinacin. Cules son los signos o los sntomas? Los sntomas de esta afeccin incluyen:  Dolor en la parte externa de la rodilla.  Un chasquido que se produce en el momento de la lesin.  Hinchazn a lo largo de la parte externa de la rodilla.  Hematoma alrededor de la rodilla.  Inestabilidad al pararse, como si la rodilla fuera a doblarse.  Dificultad al caminar por superficies irregulares.  Adormecimiento en la rodilla. Cmo se diagnostica? Esta afeccin se puede diagnosticar en funcin de lo siguiente:  Sus antecedentes mdicos.  Un examen fsico.  Estudios de diagnstico por imgenes, como una radiografa o una resonancia magntica (RM). Durante el examen fsico, el mdico le palpar el costado de la rodilla y Investment banker, operational la estabilidad al [place]. Cmo se trata? El tratamiento para esta afeccin puede incluir lo siguiente:  Dejar la rodilla apoyada sin peso hasta que la hinchazn y Chief Technology Officer se [place].  Levantar (elevar) la rodilla por encima del nivel del corazn. Esto ayuda a Building services engineer.  Aplicar hielo sobre la rodilla. Esto ayuda a Building services engineer.  Tomar un antiinflamatorio no esteroideo (AINE), como el ibuprofeno. Esto ayuda a Engineer, materials y la hinchazn.  Usar Neomia Dear rodillera y [employer] la lesin sana.  Usar Neomia Dear rodillera al realizar actividades deportivas.  Realizar ejercicios de rehabilitacin (fisioterapia).  [place]. Esta puede ser necesaria en los siguientes casos: ? El ligamento se desgarra por completo. ? La rodilla est inestable. ? La rodilla no mejora con otros tratamientos. Siga estas indicaciones en su casa: Si tiene un dispositivo ortopdico:  selo como se lo haya indicado el mdico. Quteselo solamente como se lo haya indicado el mdico.  Afloje el dispositivo ortopdico si los dedos de los pies se le adormecen, siente hormigueos o se  le enfran y se tornan de Research officer, trade union.  Mantenga limpio el dispositivo ortopdico.  Si el dispositivo ortopdico no es impermeable: ? No deje que se moje. ? Cbralo con un envoltorio hermtico cuando tome un bao de inmersin o [place]. Control del dolor, el entumecimiento y la hinchazn   Si se lo indican, aplique hielo en la parte externa de la rodilla. ? Si [place] un dispositivo ortopdico desmontable, quteselo segn las indicaciones del mdico. ? Ponga el hielo en una bolsa plstica. ? Coloque una [employer] piel y Copy. ? Coloque el hielo durante , 2 a 3veces por da.  Mueva el pie y los dedos del pie con frecuencia para reducir la rigidez y la hinchazn.  Cuando est sentado o acostado, eleve la rodilla por encima del nivel del corazn. Medicamentos  [employer] de venta libre y los recetados solamente como se lo haya indicado el mdico.  Pregntele al mdico si el medicamento recetado: ? Hace que sea necesario que evite conducir o usar maquinaria pesada. ? Puede causarle estreimiento. Es posible que deba tomar medidas para prevenir o tratar el estreimiento, por ejemplo:  Beber suficiente lquido como para Pharmacologist la orina de color amarillo plido.  Tomar medicamentos recetados o de [employer].  Consumir alimentos ricos en fibra, como frijoles, cereales integrales, y frutas y verduras frescas.  Limitar su consumo de alimentos ricos en grasa  y azcares procesados, como los alimentos fritos o dulces. Actividad  Pregntele al mdico cundo puede volver a conducir si tiene un dispositivo ortopdico en la pierna.  Retome sus actividades normales segn lo indicado por el mdico. Pregntele al mdico qu actividades son seguras para usted.  Haga ejercicio como se lo haya indicado el mdico.  No use la pierna lesionada para sostener el peso del cuerpo hasta que el mdico lo autorice. Use muletas y un dispositivo ortopdico como se lo haya  indicado el mdico. Indicaciones generales  No consuma ningn producto que contenga nicotina o tabaco, como cigarrillos, cigarrillos electrnicos y tabaco de Theatre manager. Estos pueden retrasar la recuperacin. Si necesita ayuda para dejar de consumir, consulte al mdico.  Concurra a todas las visitas de control como se lo haya indicado el mdico. Esto es importante. Cmo se evita?  Precaliente y elongue adecuadamente antes de hacer actividad fsica.  Reljese y elongue despus de hacer actividad fsica.  Dele al cuerpo tiempo para [employer] perodos de Singer fsica.  Asegrese de usar el equipo que sea apto para usted.  Tome medidas de seguridad y sea responsable al hacer actividad fsica. Esto ayudar a evitar cadas.  Haga por lo menos de ejercicios de intensidad moderada cada semana, como caminar a paso ligero o hacer gimnasia acutica.  Mantenga un buen estado fsico, que incluye lo siguiente: ? [place]. ? Flexibilidad. ? Buena capacidad cardiovascular. ? Resistencia. Comunquese con un mdico si:  Contina con dolor e hinchazn por 2 a 4semanas.  Sus sntomas empeoran.  Siente que la rodilla est inestable o se dobla. Resumen  El ligamento lateral externo (LLE) es una banda resistente de tejido que conecta el fmur al ms pequeo de los huesos de la parte inferior de la pierna. Un esguince de LLE es una distensin o un desgarro del LLE.  Esta afeccin puede tratarse de [employer], entre ellas dejar la rodilla apoyada, usar un dispositivo ortopdico, tomar medicamentos y someterse a [place].  No use la pierna lesionada para sostener el peso del cuerpo hasta que el mdico lo autorice. Use muletas y un dispositivo ortopdico como se lo haya indicado el mdico.  Comunquese con un mdico si los sntomas no mejoran en el lapso de 2 a 4semanas, o si empeoran.  Concurra a todas las visitas de control como se lo haya indicado el mdico. Esto es  importante. Esta informacin no tiene Theme park manager el consejo del mdico. Asegrese de hacerle al mdico cualquier pregunta que tenga. Document Revised: 01/30/2018 Document Reviewed: 01/30/2018 Elsevier Patient Education  2020 [employer].

## 2020-03-18 NOTE — Assessment & Plan Note (Signed)
Ongoing pain to the right knee after injury approximately 1 month ago. There is tenderness noted on the medial and lateral joint line as well as minimal laxity noted greater medially than laterally. At this time I do not feel an MRI is necessary-range of motion is excellent and patient is able to ambulate without problem. Discussed with the patient that the residual pain is most likely related to the damage that occurred to the ligaments of the knee and that this should improve with time.  Discussed the option of formal physical therapy vs. at home exercises. He would like to try exercises at home first to see if this is effective. Discussed the use of ice and heat to the joint to help with pain and any inflammation that may present.  Short burst of prednisone sent for inflammation and information provided on home exercises for ligament strain/sprain to the knee.  Recommend NSAIDs for pain after steroid is complete.  If symptoms do not improve after 2-3 weeks of at home therapy, please return to clinic for evaluation with Dr. Karie Schwalbe for further evaluation.

## 2020-11-30 ENCOUNTER — Other Ambulatory Visit: Payer: Self-pay

## 2020-11-30 ENCOUNTER — Ambulatory Visit (INDEPENDENT_AMBULATORY_CARE_PROVIDER_SITE_OTHER): Payer: Self-pay | Admitting: Medical-Surgical

## 2020-11-30 ENCOUNTER — Encounter: Payer: Self-pay | Admitting: Medical-Surgical

## 2020-11-30 VITALS — BP 116/77 | HR 84 | Temp 98.7°F | Ht 65.0 in | Wt 175.6 lb

## 2020-11-30 DIAGNOSIS — S99912D Unspecified injury of left ankle, subsequent encounter: Secondary | ICD-10-CM

## 2020-11-30 NOTE — Progress Notes (Signed)
  HPI with pertinent ROS:   CC: Left ankle injury  HPI: Pleasant 30 year old male presenting today for reports of a left ankle injury that occurred on Friday.  We are joined by Regan Rakers who is a Chemical engineer [phone].  Patient reports he was going upstairs on a machine and slipped on a step, falling and twisting his left ankle.  He was seen at an urgent care type office on Friday after the injury where they reported he was fine but should stay out of work for the next 2 weeks to allow rest and recovery.  He has been doing warm salt water soaks at home with good relief of swelling and inflammation.  He is also using Tylenol as needed which is helpful.  He does note some pain with walking but is able to ambulate without difficulty.  He has an ankle brace at home but has not worn this.  Friday afternoon was his first time he missed from work and has not been back since.  He was supposed to get a work note from the provider on Friday but unfortunately he did not wait for that.  He tried to go by there today but they were closed.  I reviewed the past medical history, family history, social history, surgical history, and allergies today and no changes were needed.  Please see the problem list section below in epic for further details.   Physical exam:   General: Well Developed, well nourished, and in no acute distress.  Neuro: Alert and oriented x3.  HEENT: Normocephalic, atraumatic.  Skin: Warm and dry. Cardiac: Regular rate and rhythm, no murmurs rubs or gallops, no lower extremity edema.  Respiratory: Clear to auscultation bilaterally. Not using accessory muscles, speaking in full sentences. Left ankle: Some mild swelling to the lateral malleolus with bruising just posterior to the lateral malleolus.  Tenderness to palpation along the area but range of motion intact.  Impression and Recommendations:    1. Injury of left ankle, subsequent encounter Some swelling and bruising to the left  lateral ankle but ambulating and tolerating weightbearing okay.  Recommend continuing salt water soaks, as needed Tylenol, and rest.  Work note provided to allow him for the next couple of weeks off.  Work note to cover from 11/26/2020 through 12/12/2020.  He can return to work on 12/13/2020 without restriction.  Okay to use ankle brace if necessary.  Make sure to wear supportive shoes when out and about.  Return if symptoms worsen or fail to improve. ___________________________________________ Thayer Ohm, DNP, APRN, FNP-BC Primary Care and Sports Medicine [facility]

## 2021-02-21 ENCOUNTER — Telehealth: Payer: Self-pay | Admitting: Medical-Surgical

## 2021-02-21 NOTE — Telephone Encounter (Signed)
Patient states that when he was younger he had a really bad problem with something and he doesn't want to speak to me about it. States that he has relations with his wife but he cant remember things and a lot is going on with him. He said when he see's a Emergency planning/management officer he tenses up and gets really nervous and wants to figure out what is going on. Patient is a 40 min patient and I only show acutes open, please advise where we can schedule patient appropriately.

## 2021-02-22 NOTE — Telephone Encounter (Signed)
Appointment made. No further questions at this time. Called for onsite interpreter.

## 2021-02-22 NOTE — Telephone Encounter (Signed)
Would you like me to put him in your 4pm slot as a 40 min patient?

## 2021-02-25 ENCOUNTER — Encounter: Payer: Self-pay | Admitting: Medical-Surgical

## 2021-02-25 ENCOUNTER — Other Ambulatory Visit: Payer: Self-pay

## 2021-02-25 ENCOUNTER — Ambulatory Visit (INDEPENDENT_AMBULATORY_CARE_PROVIDER_SITE_OTHER): Payer: Self-pay | Admitting: Medical-Surgical

## 2021-02-25 VITALS — BP 142/93 | HR 75 | Resp 20 | Ht 65.0 in | Wt 167.0 lb

## 2021-02-25 DIAGNOSIS — F39 Unspecified mood [affective] disorder: Secondary | ICD-10-CM

## 2021-02-25 DIAGNOSIS — F4310 Post-traumatic stress disorder, unspecified: Secondary | ICD-10-CM

## 2021-02-25 MED ORDER — ESCITALOPRAM OXALATE 10 MG PO TABS: 10.0000 mg | ORAL_TABLET | Freq: Every day | ORAL | 1 refills | Status: AC

## 2021-02-25 NOTE — Patient Instructions (Signed)
If you need additional help, please contact your primary care provider or call one of the resources listed below:  [facility] Behavioral Help  24-hour HelpLine at 640-565-1478 or [phone] [address] [place], [place] [address]  [facility] 800-SUICIDE or 623-130-4749  The National Suicide Prevention Lifeline 800-273-TALK or 407-117-1362  [facility] [place], [place]  Any hospital emergency room or urgent care
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
## 2021-03-11 ENCOUNTER — Ambulatory Visit (INDEPENDENT_AMBULATORY_CARE_PROVIDER_SITE_OTHER): Payer: Self-pay | Admitting: Medical-Surgical

## 2021-03-11 DIAGNOSIS — F39 Unspecified mood [affective] disorder: Secondary | ICD-10-CM

## 2021-03-11 DIAGNOSIS — F4310 Post-traumatic stress disorder, unspecified: Secondary | ICD-10-CM

## 2021-03-11 DIAGNOSIS — R454 Irritability and anger: Secondary | ICD-10-CM

## 2021-03-11 DIAGNOSIS — Z91199 Patient's noncompliance with other medical treatment and regimen due to unspecified reason: Secondary | ICD-10-CM

## 2021-03-11 NOTE — Progress Notes (Signed)
   Complete physical exam  Patient: Isaac Myers   DOB: 03/04/1998   30 y.o. Male  MRN: [id number]  Subjective:    No chief complaint on file.   Isaac Myers is a 30 y.o. male who presents today for a complete physical exam. She reports consuming a {diet types:17450} diet. {types:19826} She generally feels {DESC; WELL/FAIRLY WELL/POORLY:18703}. She reports sleeping {DESC; WELL/FAIRLY WELL/POORLY:18703}. She {does/does not:200015} have additional problems to discuss today.    Most recent fall risk assessment:    11/09/2021   10:42 AM  Fall Risk   Falls in the past year? 0  Number falls in past yr: 0  Injury with Fall? 0  Risk for fall due to : No Fall Risks  Follow up Falls evaluation completed     Most recent depression screenings:    11/09/2021   10:42 AM 09/30/2020   10:46 AM  PHQ 2/9 Scores  PHQ - 2 Score 0 0  PHQ- 9 Score 5     {VISON DENTAL STD PSA (Optional):27386}  {History (Optional):23778}  Patient Care Team: Darin Arndt, NP as PCP - General (Nurse Practitioner)   Outpatient Medications Prior to Visit  Medication Sig   fluticasone (FLONASE) 50 MCG/ACT nasal spray Place 2 sprays into both nostrils in the morning and at bedtime. After 7 days, reduce to once daily.   norgestimate-ethinyl estradiol (SPRINTEC 28) 0.25-35 MG-MCG tablet Take 1 tablet by mouth daily.   Nystatin POWD Apply liberally to affected area 2 times per day   spironolactone (ALDACTONE) 100 MG tablet Take 1 tablet (100 mg total) by mouth daily.   No facility-administered medications prior to visit.    ROS        Objective:     There were no vitals taken for this visit. {Vitals History (Optional):23777}  Physical Exam   No results found for any visits on 12/15/21. {Show previous labs (optional):23779}    Assessment & Plan:    Routine Health Maintenance and Physical Exam  Immunization History  Administered Date(s) Administered   DTaP 05/18/1999, 07/14/1999,  09/22/1999, 06/07/2000, 12/22/2003   Hepatitis A 10/18/2007, 10/23/2008   Hepatitis B 03/05/1999, 04/12/1999, 09/22/1999   HiB (PRP-OMP) 05/18/1999, 07/14/1999, 09/22/1999, 06/07/2000   IPV 05/18/1999, 07/14/1999, 03/12/2000, 12/22/2003   Influenza,inj,Quad PF,6+ Mos 01/23/2014   Influenza-Unspecified 04/24/2012   MMR 03/12/2001, 12/22/2003   Meningococcal Polysaccharide 10/23/2011   Pneumococcal Conjugate-13 06/07/2000   Pneumococcal-Unspecified 09/22/1999, 12/06/1999   Tdap 10/23/2011   Varicella 03/12/2000, 10/18/2007    Health Maintenance  Topic Date Due   HIV Screening  Never done   Hepatitis C Screening  Never done   INFLUENZA VACCINE  12/13/2021   PAP-Cervical Cytology Screening  12/15/2021 (Originally 03/03/2020)   PAP SMEAR-Modifier  12/15/2021 (Originally 03/03/2020)   TETANUS/TDAP  12/15/2021 (Originally 10/22/2021)   HPV VACCINES  Discontinued   COVID-19 Vaccine  Discontinued    Discussed health benefits of physical activity, and encouraged her to engage in regular exercise appropriate for her age and condition.  Problem List Items Addressed This Visit   None Visit Diagnoses     Annual physical exam    -  Primary   Cervical cancer screening       Need for Tdap vaccination          No follow-ups on file.     Samara Stankowski, NP   

## 2021-03-11 NOTE — Patient Instructions (Signed)
Erroneous note

## 2021-07-22 ENCOUNTER — Telehealth: Payer: Self-pay | Admitting: General Practice

## 2021-07-22 NOTE — Telephone Encounter (Signed)
Transition Care Management Follow-up Telephone Call ?Date of discharge and from where: 07/20/21 from [facility] ?How have you been since you were released from the hospital? Doing ok but feels that the hospital didn't do anything for him. He would like to make an appointment to see PCP but will need to call back to schedule that.  ?Any questions or concerns? No ? ?Items Reviewed: ?Did the pt receive and understand the discharge instructions provided? Yes  ?Medications obtained and verified? No  ?Other? No  ?Any new allergies since your discharge? No  ?Dietary orders reviewed? Yes ?Do you have support at home? Yes  ? ?Home Care and Equipment/Supplies: ?Were home health services ordered? no ? ?Functional Questionnaire: (I = Independent and D = Dependent) ?ADLs: I ? ?Bathing/Dressing- I ? ?Meal Prep- I ? ?Eating- I ? ?Maintaining continence- I ? ?Transferring/Ambulation- I ? ?Managing Meds- I ? ?Follow up appointments reviewed: ? ?PCP Hospital f/u appt confirmed? No  Patient said that he will call back to schedule. ?Specialist Hospital f/u appt confirmed? No   ?Are transportation arrangements needed? No  ?If their condition worsens, is the pt aware to call PCP or go to the Emergency Dept.? No ?Was the patient provided with contact information for the PCP's office or ED? No ?Was to pt encouraged to call back with questions or concerns? No  ?

## 2022-02-22 IMAGING — XA DG INJECT/[PERSON_NAME] INC NEEDLE/CATH/PLC EPI/CERV/THOR W/IMG
2 series · 2 of 2 positions shown · non-contrast
Comparison: none

CLINICAL DATA: Cervical spondylosis without myelopathy. Upper
thoracic back pain.

[Series 1: ortho standard · 1 of 1 slices shown (1 of 2)]
[im 1/1]
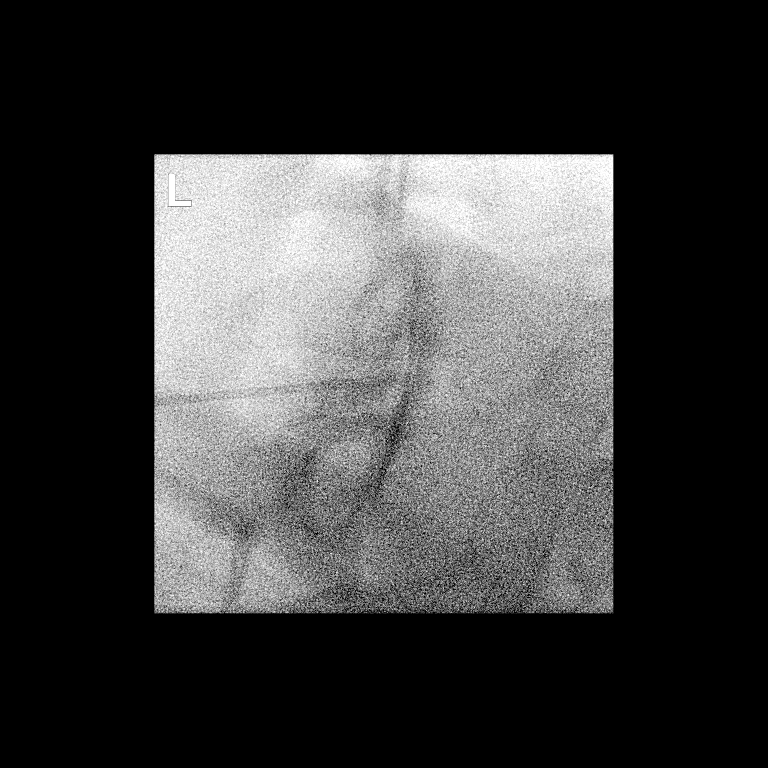

[Series 2: ortho standard · 1 of 1 slices shown (2 of 2)]
[im 1/1]
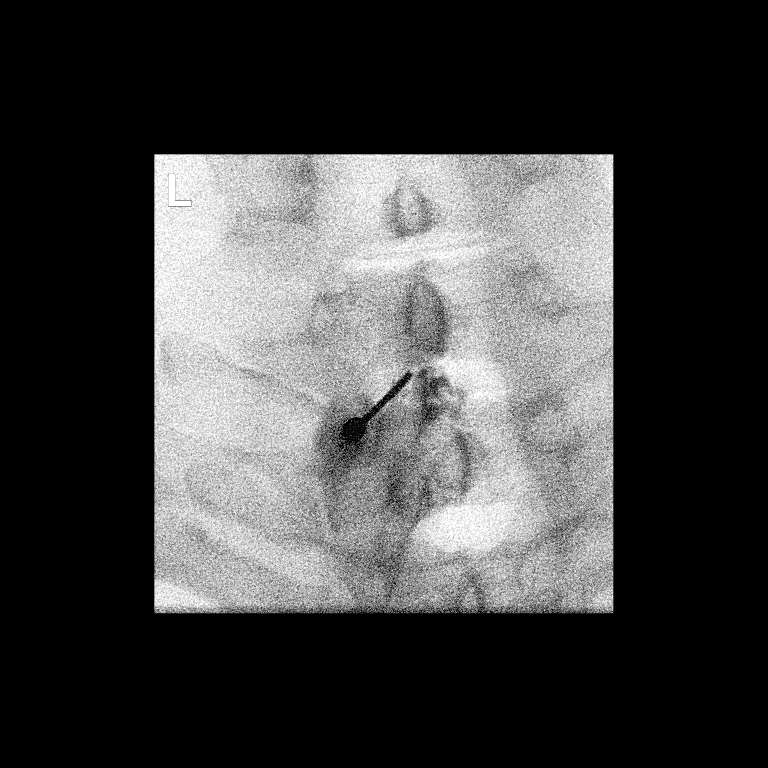

[2 of 2 positions shown; findings below may reference images not displayed]

FLUOROSCOPY TIME:  Fluoroscopy Time: 16 seconds

Radiation Exposure Index: 5.86 microGray*m^2

PROCEDURE:
The procedure, risks, benefits, and alternatives were explained to
the patient. Questions regarding the procedure were encouraged and
answered. The patient understands and consents to the procedure.

CERVICAL EPIDURAL INJECTION

An interlaminar approach was performed on the left at C7-T1. A
inch 20 gauge epidural needle was advanced using loss-of-resistance
technique.

DIAGNOSTIC EPIDURAL INJECTION

Injection of Isovue-M 300 shows a good epidural pattern with spread
above and below the level of needle placement, primarily on the
left. No vascular opacification is seen.

THERAPEUTIC EPIDURAL INJECTION

1.5 ml of Kenalog 40 mixed with 2 ml of normal saline were then
instilled. The procedure was well-tolerated, and the patient was
discharged thirty minutes following the injection in good condition.
IMPRESSION: Technically successful interlaminar epidural injection on the left
at C7-T1.
